# Patient Record
Sex: Male | Born: 1960
Health system: Southern US, Community
[De-identification: ages and names within clinical notes are randomized; demographics above are authoritative.]

## PROBLEM LIST (undated history)

## (undated) DIAGNOSIS — E039 Hypothyroidism, unspecified: Secondary | ICD-10-CM

## (undated) DIAGNOSIS — Z8639 Personal history of other endocrine, nutritional and metabolic disease: Secondary | ICD-10-CM

## (undated) DIAGNOSIS — Z96649 Presence of unspecified artificial hip joint: Secondary | ICD-10-CM

## (undated) DIAGNOSIS — M109 Gout, unspecified: Secondary | ICD-10-CM

## (undated) DIAGNOSIS — Z9884 Bariatric surgery status: Secondary | ICD-10-CM

## (undated) DIAGNOSIS — L409 Psoriasis, unspecified: Secondary | ICD-10-CM

## (undated) DIAGNOSIS — A6000 Herpesviral infection of urogenital system, unspecified: Secondary | ICD-10-CM

## (undated) DIAGNOSIS — R079 Chest pain, unspecified: Secondary | ICD-10-CM

## (undated) HISTORY — DX: Chest pain, unspecified: R07.9

## (undated) HISTORY — PX: TOTAL HIP ARTHROPLASTY: SHX124

## (undated) HISTORY — DX: Personal history of other endocrine, nutritional and metabolic disease: Z86.39

## (undated) HISTORY — PX: LASIK: SHX215

## (undated) HISTORY — PX: CYST EXCISION: SHX5701

## (undated) HISTORY — DX: Psoriasis, unspecified: L40.9

## (undated) HISTORY — DX: Gout, unspecified: M10.9

## (undated) HISTORY — PX: COLONOSCOPY: SHX174

---

## 1999-01-28 ENCOUNTER — Emergency Department (HOSPITAL_COMMUNITY): Admission: EM | Admit: 1999-01-28 | Discharge: 1999-01-29 | Payer: Self-pay | Admitting: Emergency Medicine

## 1999-01-28 ENCOUNTER — Encounter: Payer: Self-pay | Admitting: Emergency Medicine

## 1999-01-29 ENCOUNTER — Encounter: Payer: Self-pay | Admitting: Emergency Medicine

## 2006-08-16 ENCOUNTER — Emergency Department (HOSPITAL_COMMUNITY): Admission: EM | Admit: 2006-08-16 | Discharge: 2006-08-17 | Payer: Self-pay | Admitting: Emergency Medicine

## 2007-12-13 ENCOUNTER — Ambulatory Visit (HOSPITAL_BASED_OUTPATIENT_CLINIC_OR_DEPARTMENT_OTHER): Admission: RE | Admit: 2007-12-13 | Discharge: 2007-12-13 | Payer: Self-pay | Admitting: Surgery

## 2007-12-17 ENCOUNTER — Ambulatory Visit (HOSPITAL_COMMUNITY): Admission: RE | Admit: 2007-12-17 | Discharge: 2007-12-17 | Payer: Self-pay | Admitting: Surgery

## 2007-12-19 ENCOUNTER — Ambulatory Visit (HOSPITAL_COMMUNITY): Admission: RE | Admit: 2007-12-19 | Discharge: 2007-12-19 | Payer: Self-pay | Admitting: Surgery

## 2007-12-21 ENCOUNTER — Ambulatory Visit: Payer: Self-pay | Admitting: Internal Medicine

## 2007-12-30 ENCOUNTER — Encounter: Admission: RE | Admit: 2007-12-30 | Discharge: 2007-12-30 | Payer: Self-pay | Admitting: Surgery

## 2008-02-07 ENCOUNTER — Ambulatory Visit (HOSPITAL_COMMUNITY): Admission: RE | Admit: 2008-02-07 | Discharge: 2008-02-07 | Payer: Self-pay | Admitting: Surgery

## 2008-05-05 ENCOUNTER — Encounter: Admission: RE | Admit: 2008-05-05 | Discharge: 2008-08-03 | Payer: Self-pay | Admitting: Surgery

## 2008-05-19 ENCOUNTER — Ambulatory Visit (HOSPITAL_COMMUNITY): Admission: RE | Admit: 2008-05-19 | Discharge: 2008-05-20 | Payer: Self-pay | Admitting: Surgery

## 2008-09-01 ENCOUNTER — Encounter: Admission: RE | Admit: 2008-09-01 | Discharge: 2008-09-01 | Payer: Self-pay | Admitting: Surgery

## 2008-11-20 HISTORY — PX: BARIATRIC SURGERY: SHX1103

## 2010-12-11 ENCOUNTER — Encounter: Payer: Self-pay | Admitting: Emergency Medicine

## 2011-01-12 ENCOUNTER — Encounter (INDEPENDENT_AMBULATORY_CARE_PROVIDER_SITE_OTHER): Payer: Self-pay | Admitting: *Deleted

## 2011-01-13 ENCOUNTER — Encounter: Payer: Self-pay | Admitting: Gastroenterology

## 2011-01-17 NOTE — Letter (Signed)
Summary: [facility] Instructions  [facility]  [address] [place], [place] [address]   Phone: [phone]  Fax: [phone]       Brett Vaughn    June 10, 1961    MRN: [id number]        Procedure Day Dorna Bloom: Wednesday 01/25/11     Arrival Time: 1:00 pm      Procedure Time: 2:00 pm     Location of Procedure:                    _X_  [facility] (4th Floor)  PREPARATION FOR COLONOSCOPY WITH MOVIPREP   Starting 5 days prior to your procedure _Friday 01/20/11 _ do not eat nuts, seeds, popcorn, corn, beans, peas,  salads, or any raw vegetables.  Do not take any fiber supplements (e.g. Metamucil, Citrucel, and Benefiber).  THE DAY BEFORE YOUR PROCEDURE         DATE: _3/7/12  _  DAY: _Wednesday  _  1.  Drink clear liquids the entire day-NO SOLID FOOD  2.  Do not drink anything colored red or purple.  Avoid juices with pulp.  No orange juice.  3.  Drink at least 64 oz. (8 glasses) of fluid/clear liquids during the day to prevent dehydration and help the prep work efficiently.  CLEAR LIQUIDS INCLUDE: Water Jello Ice Popsicles Tea (sugar ok, no milk/cream) Powdered fruit flavored drinks Coffee (sugar ok, no milk/cream) Gatorade Juice: apple, white grape, white cranberry  Lemonade Clear bullion, consomm, broth Carbonated beverages (any kind) Strained chicken noodle soup Hard Candy                             4.  In the morning, mix first dose of MoviPrep solution:    Empty 1 Pouch A and 1 Pouch B into the disposable container    Add lukewarm drinking water to the top line of the container. Mix to dissolve    Refrigerate (mixed solution should be used within 24 hrs)  5.  Begin drinking the prep at 5:00 p.m. The MoviPrep container is divided by 4 marks.   Every 15 minutes drink the solution down to the next mark (approximately 8 oz) until the full liter is complete.   6.  Follow completed prep with 16 oz of clear liquid of your choice (Nothing red  or purple).  Continue to drink clear liquids until bedtime.  7.  Before going to bed, mix second dose of MoviPrep solution:    Empty 1 Pouch A and 1 Pouch B into the disposable container    Add lukewarm drinking water to the top line of the container. Mix to dissolve    Refrigerate  THE DAY OF YOUR PROCEDURE      DATE: _3/7/12  _ DAY: _Wednesday  _  Beginning at 9  a.m. (5 hours before procedure):         1. Every 15 minutes, drink the solution down to the next mark (approx 8 oz) until the full liter is complete.  2. Follow completed prep with 16 oz. of clear liquid of your choice.    3. You may drink clear liquids until _12 noon  _ (2 HOURS BEFORE PROCEDURE).   MEDICATION INSTRUCTIONS  Unless otherwise instructed, you should take regular prescription medications with a small sip of water   as early as possible the morning of your procedure.  OTHER INSTRUCTIONS  You will need a responsible adult at least 52 years of age to accompany you and drive you home.   This person must remain in the waiting room during your procedure.  Wear loose fitting clothing that is easily removed.  Leave jewelry and other valuables at home.  However, you may wish to bring a book to read or  an iPod/MP3 player to listen to music as you wait for your procedure to start.  Remove all body piercing jewelry and leave at home.  Total time from sign-in until discharge is approximately 2-3 hours.  You should go home directly after your procedure and rest.  You can resume normal activities the  day after your procedure.  The day of your procedure you should not:   Drive   Make legal decisions   Operate machinery   Drink alcohol   Return to work  You will receive specific instructions about eating, activities and medications before you leave.    The above instructions have been reviewed and explained to me by   Sherren Kerns RN  January 13, 2011 4:35 PM     I fully  understand and can verbalize these instructions _____________________________ Date _________

## 2011-01-17 NOTE — Miscellaneous (Signed)
Summary: previsit prep/rm  Clinical Lists Changes  Medications: Added new medication of MOVIPREP 100 GM  SOLR (PEG-KCL-NACL-NASULF-NA ASC-C) As per prep instructions. - Signed Rx of MOVIPREP 100 GM  SOLR (PEG-KCL-NACL-NASULF-NA ASC-C) As per prep instructions.;  #1 x 0;  Signed;  Entered by: Sherren Kerns RN;  Authorized by: Mardella Layman MD [facility];  Method used: [facility] [id number] [address], [place], [place], [place]  [address], Ph: [phone], Fax: [phone] Observations: Added new observation of ALLERGY REV: Done (01/13/2011 15:52) Added new observation of NKA: T (01/13/2011 15:52)    Prescriptions: MOVIPREP 100 GM  SOLR (PEG-KCL-NACL-NASULF-NA ASC-C) As per prep instructions.  #1 x 0   Entered by:   Sherren Kerns RN   Authorized by:   Mardella Layman MD [facility]   Signed by:   Sherren Kerns RN on 01/13/2011   Method used:   [facility]  [address] [id number]       [address]       [place], [place]  [address]       Ph: [phone]       Fax: [phone]   RxID:   [id number]

## 2011-01-23 ENCOUNTER — Telehealth: Payer: Self-pay | Admitting: Gastroenterology

## 2011-01-25 ENCOUNTER — Other Ambulatory Visit: Payer: Self-pay | Admitting: Gastroenterology

## 2011-01-31 NOTE — Progress Notes (Signed)
Summary: Canceled COL  Phone Note Call from Patient Call back at Home Phone [phone]   Caller: Patient Call For: Dr. Jarold Motto Summary of Call: Pt. canceled his COL for 01-25-11 b/c he found out that his Ins. would be billed as outpatient and his deductible is too high and cannot afford. Would you like pt. charged the cancelation fee? Initial call taken by: Karna Christmas,  January 23, 2011 11:00 AM  Follow-up for Phone Call        NO.. Follow-up by: Mardella Layman MD [facility],  January 23, 2011 11:03 AM  Additional Follow-up for Phone Call Additional follow up Details #1::        Patient NOT BILLED.  Additional Follow-up by: Leanor Kail [facility],  January 25, 2011 11:41 AM

## 2011-04-04 NOTE — Procedures (Signed)
Brett Vaughn, Brett Vaughn              ACCOUNT NO.:  [id number]   MEDICAL RECORD NO.:  [id number]          PATIENT TYPE:  OUT   LOCATION:  SLEEP CENTER                 FACILITY[facility]  [facility]   PHYSICIAN:  Clinton D. Maple Hudson, MD, FCCP, FACPDATE OF BIRTH:  February 03, 1959   DATE OF STUDY:  12/13/2007                            NOCTURNAL POLYSOMNOGRAM   REFERRING PHYSICIAN:  Thornton Park. Daphine Deutscher, MD   INDICATIONS FOR PROCEDURE:  Hypersomnia with sleep apnea.   RESULTS:  Epward sleepiness score 4/24, BMI 44.8, weight 330 pounds.  Height 72 inches. Neck 20 inches.   HOME MEDICATIONS:  Charted and reviewed.   SLEEP ARCHITECTURE:  Total sleep time 412.5 minutes with sleep  efficiency 90.9%. Stage 1 was 2.1%; Stage 2, 79.4%; Stage 3 absent. REM  18.5% of total sleep time. Sleep latency 26 minutes, REM latency 142  minutes, awake after sleep onset 12 minutes, arousal index 4.1. No  bedtime medication taken.   RESPIRATORY DATA:  Apnea/hypopnea index (AHI) 11.8 obstructive events  per hour, indicating mild obstructive sleep apnea/hypopnea syndrome.  There were 81 scored events including 1 obstructive apnea and 80  hypopnea's. Events were not positional. This was a diagnostic NP SG  protocol study. REM AHI was 30.6.   OXYGEN DATA:  Moderate snoring with oxygen desaturation to a nadir of  82%. Mean oxygen saturation through the study was 90.6% on room air.   CARDIAC DATA:  Normal sinus rhythm.   MOVEMENT/PARASOMNIA:  A total of 25 periodic limb movement events were  counted, of which none were identified with arousals. Movement index was  3.6.   IMPRESSION/RECOMMENDATIONS:  1. Mild obstructive sleep apnea/hypopnea syndrome, AHI 11.8 per hour      with non-positional events. Increased frequency while in REM.      Moderate snoring and mild oxygen desaturation to a nadir of 82%.  2. Scores in this range may be appropriate addressed with CPAP      intervention but weight loss and treatment for      upper  airway obstruction or congestion with encouragement to sleep      off flat of back may be sufficient, as clinically appropriate.      Clinton D. Maple Hudson, MD, FCCP, FACP  Diplomate, Biomedical engineer of Sleep Medicine  Electronically Signed     CDY/MEDQ  D:  12/21/2007 11:57:30  T:  12/21/2007 22:51:17  Job:  [id number]

## 2011-04-04 NOTE — Op Note (Signed)
NAMEDARWIN, ROTHLISBERGER              ACCOUNT NO.:  [id number]   MEDICAL RECORD NO.:  [id number]          PATIENT TYPE:  OIB   LOCATION:  0098                         FACILITY:  [facility]   PHYSICIAN:  Thornton Park. Daphine Deutscher, MD  DATE OF BIRTH:  1961/03/24   DATE OF PROCEDURE:  05/19/2008  DATE OF DISCHARGE:                               OPERATIVE REPORT   05/19/2008   PREOPERATIVE DIAGNOSIS:  Morbid obesity, preop BMI 46.   PROCEDURE:  Laparoscopic adjustable gastric band (Allergan APL system).   SURGEON:  Thornton Park. Daphine Deutscher, MD.   ASSISTANTMarland Kitchen  Sandria Bales. Ezzard Standing, M.D.   ANESTHESIA:  General endotracheal.   FINDINGS:  No hiatal hernia noted on the balloon test.   DESCRIPTION OF PROCEDURE:  Bronc Brosseau is a 50 year old Caucasian  male taken room #1 on May 19, 2008.  He is placed under general  anesthesia.  The abdomen was clipped, prepped with Techni-Care, and  draped sterilely.  I entered the abdomen through the left upper quadrant  using an OptiVu technique without difficulty.  Once I entered the  abdomen, it was insufflated.  There were some adhesions in the left  upper quadrant which were taken down sharply with dissection.  This was  done after I put the optical port down to the left of the umbilicus with  the 2 ports on the right side.  The 15 was placed in the upper position  on the right.  It was placed at a very oblique angle.   Once this was done, I surveyed the abdomen; and then went up and  surveyed after I placed the Nathanson's retractor at the EG junction.  Because of its size preoperatively, and it being a man, I elected to put  in an APL band system.  He had a modest amount of fat around his  proximal foregut.  The tube was inserted into the stomach, and 15 mL of  air was put into the balloon.  This was then pulled back, and it hung up  at the diaphragm.  There was no evidence of a sliding hernia based on  this test.  I did not see any dimpling; and because the patient  had no  symptoms of reflux, again, asking him in the holding area as well as  preoperatively; I elected to not try to do anything further in the way  of dissection.  I went ahead and easily found a spot, using a pars  flaccida technique, preserving his hepatic branch of his vagal nerve,  and after I opened the window passed a band passer without difficulty.  The APL band was inserted, brought around, and it was snapped in place.  It was then closed over the tubing and to make sure that the locking  device was seen on the band.  It was then held to the right, and was  plicated with 3 sutures of Surgitek and tie knots.   Everything looked to be in order, and looked good, and moved easily.  The tubing was then brought through the port on the right, on the lower  side, which  was expanded.  I created a place for the port on the fascia;  and the port came out to lie a little bit toward the medial side of that  incision.  It was replaced with 4 sutures sutured to the fascia; and  after connecting, it looked good and the excess tubing was placed into  the abdomen.  Wounds were irrigated and closed with 4-0 Vicryl and  Benzoin and Steri-Strips.  The patient tolerated the procedure well and  was taken to recovery room in satisfactory condition.      Thornton Park Daphine Deutscher, MD  Electronically Signed     MBM/MEDQ  D:  05/19/2008  T:  05/19/2008  Job:  [id number]   cc:   Quita Skye. Artis Flock, M.D.  Fax: [phone]

## 2011-08-17 LAB — BASIC METABOLIC PANEL
Calcium: 9.5
Chloride: 105
Creatinine, Ser: 1.11

## 2011-08-17 LAB — DIFFERENTIAL
Basophils Absolute: 0.1
Basophils Relative: 1
Neutro Abs: 5.6
Neutrophils Relative %: 62

## 2011-08-17 LAB — HEMOGLOBIN AND HEMATOCRIT, BLOOD: Hemoglobin: 16.8

## 2011-08-17 LAB — CBC
MCHC: 34.2
RBC: 4.6
RDW: 14.5

## 2011-10-16 ENCOUNTER — Telehealth (INDEPENDENT_AMBULATORY_CARE_PROVIDER_SITE_OTHER): Payer: Self-pay | Admitting: Surgery

## 2011-10-16 NOTE — Telephone Encounter (Signed)
10/16/11 recall letter mailed to patient for bariatric surgery follow-up. Adv pt to call [facility] to schedule an appt..cef °

## 2012-12-05 ENCOUNTER — Telehealth (INDEPENDENT_AMBULATORY_CARE_PROVIDER_SITE_OTHER): Payer: Self-pay | Admitting: Surgery

## 2012-12-05 NOTE — Telephone Encounter (Signed)
12/05/12 tried to reach pt to make bariatric surgery follow-up appt. His phone was not set up to receive messages. I mailed recall letter to call [phone] and schedule a bariatric follow-up with Dr. Daphine Deutscher. Pt last had lap band fill adjustment 09/09/09. (lss)

## 2013-10-27 ENCOUNTER — Ambulatory Visit
Admission: RE | Admit: 2013-10-27 | Discharge: 2013-10-27 | Disposition: A | Discharge status: Home / Self Care | Payer: BC Managed Care – PPO | Source: Ambulatory Visit | Attending: Physician Assistant | Admitting: Physician Assistant

## 2013-10-27 ENCOUNTER — Other Ambulatory Visit: Payer: Self-pay | Admitting: Physician Assistant

## 2013-10-27 DIAGNOSIS — R059 Cough, unspecified: Secondary | ICD-10-CM

## 2013-10-27 DIAGNOSIS — R05 Cough: Secondary | ICD-10-CM

## 2013-10-27 DIAGNOSIS — R509 Fever, unspecified: Secondary | ICD-10-CM

## 2014-02-17 ENCOUNTER — Ambulatory Visit (INDEPENDENT_AMBULATORY_CARE_PROVIDER_SITE_OTHER): Payer: BC Managed Care – PPO | Admitting: Cardiovascular Disease

## 2014-02-17 ENCOUNTER — Encounter: Payer: Self-pay | Admitting: Cardiovascular Disease

## 2014-02-17 ENCOUNTER — Encounter: Payer: Self-pay | Admitting: *Deleted

## 2014-02-17 VITALS — BP 108/70 | HR 72 | Ht 70.0 in | Wt 264.4 lb

## 2014-02-17 DIAGNOSIS — M109 Gout, unspecified: Secondary | ICD-10-CM | POA: Insufficient documentation

## 2014-02-17 DIAGNOSIS — R079 Chest pain, unspecified: Secondary | ICD-10-CM | POA: Insufficient documentation

## 2014-02-17 DIAGNOSIS — L409 Psoriasis, unspecified: Secondary | ICD-10-CM | POA: Insufficient documentation

## 2014-02-17 DIAGNOSIS — Z8639 Personal history of other endocrine, nutritional and metabolic disease: Secondary | ICD-10-CM | POA: Insufficient documentation

## 2014-02-17 NOTE — Progress Notes (Signed)
Patient ID: Brett Vaughn, male   DOB: 03-May-1961, 53 y.o.   MRN: [id number]  53 yo referred by Dr Marisue Humble for chest pain Pain is very atypical.  Hervey Ard and fleeting going on for a few weeks.  Positional and some help with motrin.  No history of CAD No other CRFls  Obes with previous lab band.  Somewhat sedentary Pain is not worse with ambulaiton or stress.  No fever pleurisy no history of pericardial disease  Compliant with meds Pains actually improved last two weeks.  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  All other systems reviewed and negative   General: Affect appropriate Healthy:  appears stated age 5: normal Neck supple with no adenopathy JVP normal no bruits no thyromegaly Lungs clear with no wheezing and good diaphragmatic motion Heart:  S1/S2 no murmur,rub, gallop or click PMI normal Abdomen: benighn, BS positve, no tenderness, no AAA  Porta cath form lab band palpable RLQ no bruit.  No HSM or HJR Distal pulses intact with no bruits No edema Neuro non-focal Skin warm and dry  Multiple tatoos  No muscular weakness  Medications Current Outpatient Prescriptions  Medication Sig Dispense Refill  . levothyroxine (SYNTHROID, LEVOTHROID) 88 MCG tablet Take 88 mcg by mouth daily before breakfast.      . Multiple Vitamin (MULTIVITAMIN) tablet Take 1 tablet by mouth daily.       No current facility-administered medications for this visit.    Allergies Penicillins  Family History: Family History  Problem Relation Age of Onset  . Prostate cancer      Social History: History   Social History  . Marital Status: Divorced    Spouse Name: N/A    Number of Children: N/A  . Years of  Education: N/A   Occupational History  . Not on file.   Social History Main Topics  . Smoking status: Former Smoker    Quit date: 02/17/1985  . Smokeless tobacco: Not on file  . Alcohol Use: Not on file  . Drug Use: Not on file  . Sexual Activity: Not on file   Other Topics Concern  . Not on file   Social History Narrative  . No narrative on file    Electrocardiogram:  NSR normal ECG   Assessment and Plan

## 2014-02-17 NOTE — Assessment & Plan Note (Signed)
Was as high as 386 before surgery the down to 286 with recent weight gain due to wifes cooking Discussed low carb diet f/u primary and consider referral to bariatric center to follow band

## 2014-02-17 NOTE — Assessment & Plan Note (Signed)
Atypical normal ECG f/u ETT

## 2014-02-17 NOTE — Patient Instructions (Signed)

## 2014-03-20 ENCOUNTER — Ambulatory Visit (INDEPENDENT_AMBULATORY_CARE_PROVIDER_SITE_OTHER): Payer: BC Managed Care – PPO | Admitting: Physician Assistant

## 2014-03-20 DIAGNOSIS — R079 Chest pain, unspecified: Secondary | ICD-10-CM

## 2014-03-20 NOTE — Progress Notes (Signed)
Exercise Treadmill Test  Pre-Exercise Testing Evaluation Rhythm: normal sinus  Rate: 63 bpm     Test  Exercise Tolerance Test Ordering MD: Jenkins Rouge, MD  Interpreting MD: Richardson Dopp, PA-C  Unique Test No: 1  Treadmill:  1  Indication for ETT: chest pain - rule out ischemia  Contraindication to ETT: No   Stress Modality: exercise - treadmill  Cardiac Imaging Performed: non   Protocol: standard Bruce - maximal  Max BP:  193/88  Max MPHR (bpm):  166 85% MPR (bpm):  141  MPHR obtained (bpm):  164 % MPHR obtained:  98  Reached 85% MPHR (min:sec):  4:38 Total Exercise Time (min-sec):  7:00  Workload in METS:  8.4 Borg Scale: 16  Reason ETT Terminated:  desired heart rate attained    ST Segment Analysis At Rest: normal ST segments - no evidence of significant ST depression With Exercise: non-specific ST changes  Other Information Arrhythmia:  No Angina during ETT:  absent (0) Quality of ETT:  diagnostic  ETT Interpretation:  normal - no evidence of ischemia by ST analysis  Comments: Good exercise capacity. No chest pain. Normal BP response to exercise. No ST changes to suggest ischemia.   Recommendations: F/u with Dr. Jenkins Rouge as planned. Signed, Richardson Dopp, PA-C   03/20/2014 10:54 AM

## 2014-03-25 ENCOUNTER — Ambulatory Visit (INDEPENDENT_AMBULATORY_CARE_PROVIDER_SITE_OTHER): Payer: BC Managed Care – PPO | Admitting: Surgery

## 2014-03-25 ENCOUNTER — Encounter (INDEPENDENT_AMBULATORY_CARE_PROVIDER_SITE_OTHER): Payer: Self-pay | Admitting: Surgery

## 2014-03-25 VITALS — BP 118/78 | HR 78 | Temp 97.0°F | Resp 14 | Ht 71.0 in | Wt 257.2 lb

## 2014-03-25 DIAGNOSIS — Z9884 Bariatric surgery status: Secondary | ICD-10-CM

## 2014-03-25 NOTE — Patient Instructions (Signed)
Laparoscopic Gastric Band Surgery Care After These instructions give you information on caring for yourself after your procedure. Your doctor may also give you more specific instructions. Call your doctor if you have any problems or questions after your procedure. HOME CARE   Take walks throughout the day. Do not sit for longer than 1 hour while awake for 4 to 6 weeks.  You may shower 2 days after surgery. Pat the surgery cuts (incisions) dry. Do not rub the surgery cuts.  Do your coughing and deep breathing exercises.  Do not lift, push, or pull anything heavy until your doctor says it is okay.  Only take medicines as told by your doctor. Do not drive while taking pain medicine.  Drink plenty of fluids to keep your pee (urine) clear or pale yellow.  Stay on a liquid diet as long as your doctor tells you to.  Do not drink caffeine for 1 month.  Change bandages (dressings) as told by your doctor.  Check your surgery cuts for redness, pufffiness (swelling), abnormal coloring, fluid, or bleeding.  Follow your doctor's advice about vitamin and protein needs after surgery. GET HELP RIGHT AWAY IF:  You feel sick to your stomach (nauseous) and throw up (vomit).  You have pain and discomfort with swallowing.  You develop shortness of breath or difficulty breathing.  You have pain, puffiness, or feel warmth on your lower body.  You have very bad calf pain or pain not relieved by medicine.  You have a temperature by mouth above 102 F (38.9 C).  Your surgery cuts look red, puffy, or they leak fluid.  Your poop (stool) is black, tarry, or dark red.  You have chills.  You have chest pain.  You feel confused.  You have slurred speech.  You feel lightheaded when standing.  You suddenly feel weak.  You have any questions or concerns. MAKE SURE YOU:   Understand these instructions.  Will watch your condition.  Will get help right away if you are not doing well or get  worse. Document Released: 12/09/2010 Document Revised: 03/03/2013 Document Reviewed: 12/09/2010 [facility] Patient Information 2014 [place], [place].

## 2014-03-25 NOTE — Progress Notes (Signed)
Lapband Fill Encounter Problem List:   Patient Active Problem List   Diagnosis Date Noted  . Lapband APL 2009 03/25/2014  . Morbid obesity 02/17/2014  . Chest pain   . H/O hypogonadism   . Gout   . Psoriasis     Brett Vaughn Body mass index is 35.89 kg/(m^2). Weight loss since surgery  73.6   Having regurgitation?:  Yes since Sunday  Feel that they need a fill?  Too tight  Nocturnal reflux?  yes  Amount of fill  -5 cc     Instructions given and weight loss goals discussed.    On Sunday, patient ate some cottage cheese and pineapple and became obstructed.  Since then he has been partially obstructed to liquids.  Suspect anterior prolapse of APL band.  Fluid removed.    [name] Done, MD, FACS

## 2014-03-25 NOTE — Progress Notes (Signed)
Normal stress test

## 2014-03-27 NOTE — Progress Notes (Signed)
PT  AWARE OF  GXT  RESULTS ./CY 

## 2014-04-10 ENCOUNTER — Encounter (INDEPENDENT_AMBULATORY_CARE_PROVIDER_SITE_OTHER): Payer: Self-pay | Admitting: Surgery

## 2014-04-10 ENCOUNTER — Ambulatory Visit (INDEPENDENT_AMBULATORY_CARE_PROVIDER_SITE_OTHER): Payer: BC Managed Care – PPO | Admitting: Surgery

## 2014-04-10 VITALS — BP 132/82 | HR 72 | Temp 97.5°F | Resp 16 | Ht 71.0 in | Wt 273.4 lb

## 2014-04-10 DIAGNOSIS — Z9884 Bariatric surgery status: Secondary | ICD-10-CM

## 2014-04-10 NOTE — Progress Notes (Signed)
Lapband Fill Encounter Problem List:   Patient Active Problem List   Diagnosis Date Noted  . Lapband APL 2009 03/25/2014  . Morbid obesity 02/17/2014  . Chest pain   . H/O hypogonadism   . Gout   . Psoriasis     Brett Vaughn Body mass index is 38.15 kg/(m^2). Weight loss since surgery  57  Having regurgitation?:  no  Feel that they need a fill?  yes  Nocturnal reflux?  no  Amount of fill  4     Instructions given and weight loss goals discussed.    He has gained 17 lbs during his band vacation.  He went to [facility] bike week and ate and drank a lot.  Will see back in 6 weeks or before if not tight enough  [name] Done, MD, FACS

## 2014-04-10 NOTE — Patient Instructions (Signed)

## 2014-05-15 ENCOUNTER — Ambulatory Visit (INDEPENDENT_AMBULATORY_CARE_PROVIDER_SITE_OTHER): Payer: BC Managed Care – PPO | Admitting: Surgery

## 2014-05-15 ENCOUNTER — Encounter (INDEPENDENT_AMBULATORY_CARE_PROVIDER_SITE_OTHER): Payer: Self-pay | Admitting: Surgery

## 2014-05-15 VITALS — BP 118/75 | HR 58 | Temp 97.9°F | Resp 14 | Ht 71.0 in | Wt 273.6 lb

## 2014-05-15 DIAGNOSIS — Z4651 Encounter for fitting and adjustment of gastric lap band: Secondary | ICD-10-CM

## 2014-05-15 NOTE — Patient Instructions (Signed)

## 2014-05-15 NOTE — Progress Notes (Signed)
Lapband Fill Encounter Problem List:   Patient Active Problem List   Diagnosis Date Noted  . Lapband APL 2009 03/25/2014  . Morbid obesity 02/17/2014  . Chest pain   . H/O hypogonadism   . Gout   . Psoriasis     Brett Vaughn Body mass index is 38.18 kg/(m^2). Weight loss since surgery  57  Having regurgitation?:  no  Feel that they need a fill?  yes  Nocturnal reflux?  no  Amount of fill  0.5     Instructions given and weight loss goals discussed.    He is doing well and wants a fill he has been able to eat more since I put him on to a band vacation.  His office chart is not available.    [name] Done, MD, FACS

## 2014-05-21 ENCOUNTER — Encounter (INDEPENDENT_AMBULATORY_CARE_PROVIDER_SITE_OTHER): Payer: BC Managed Care – PPO | Admitting: Surgery

## 2014-07-01 ENCOUNTER — Encounter (INDEPENDENT_AMBULATORY_CARE_PROVIDER_SITE_OTHER): Payer: Self-pay | Admitting: Surgery

## 2014-07-01 ENCOUNTER — Ambulatory Visit (INDEPENDENT_AMBULATORY_CARE_PROVIDER_SITE_OTHER): Payer: BC Managed Care – PPO | Admitting: Surgery

## 2014-07-01 VITALS — BP 120/70 | HR 64 | Resp 16 | Ht 71.0 in | Wt 278.6 lb

## 2014-07-01 DIAGNOSIS — Z4651 Encounter for fitting and adjustment of gastric lap band: Secondary | ICD-10-CM

## 2014-07-01 NOTE — Progress Notes (Signed)
Lapband Fill Encounter Problem List:   Patient Active Problem List   Diagnosis Date Noted  . Lapband APL 2009 03/25/2014  . Morbid obesity 02/17/2014  . Chest pain   . H/O hypogonadism   . Gout   . Psoriasis     Brett Vaughn Body mass index is 38.87 kg/(m^2). Weight loss since surgery  Unable to locate old chart  Having regurgitation?:  no  Feel that they need a fill?  yes  Nocturnal reflux?  no  Amount of fill  1 cc     Instructions given and weight loss goals discussed.    Today's weight is 278.  No old chart is available.  He will call be in 6 weeks as needed.   [name] Done, MD, FACS

## 2014-07-01 NOTE — Patient Instructions (Signed)

## 2014-07-03 ENCOUNTER — Encounter (INDEPENDENT_AMBULATORY_CARE_PROVIDER_SITE_OTHER): Payer: BC Managed Care – PPO | Admitting: Surgery

## 2014-07-25 ENCOUNTER — Telehealth (INDEPENDENT_AMBULATORY_CARE_PROVIDER_SITE_OTHER): Payer: Self-pay | Admitting: Surgery

## 2014-07-25 MED ORDER — PROMETHAZINE HCL 25 MG RE SUPP: 25.0000 mg | Freq: Four times a day (QID) | RECTAL | Status: DC | PRN

## 2014-07-25 MED ORDER — ONDANSETRON HCL 4 MG PO TABS: 4.0000 mg | ORAL_TABLET | Freq: Three times a day (TID) | ORAL | Status: DC | PRN

## 2014-07-25 NOTE — Telephone Encounter (Signed)
Brett Vaughn  1961/08/07 [id number]  Patient Care Team: Donnie Coffin, MD as PCP - General (Family Medicine)  This patient is a 53 y.o.male who calls today for surgical evaluation.   Date of procedure/visit: 05/19/2008  Surgery: PREOPERATIVE DIAGNOSIS: Morbid obesity, preop BMI 46.  PROCEDURE: Laparoscopic adjustable gastric band (Allergan APL system).  SURGEON: Isabel Caprice. Hassell Done, MD.    Reason for call: n/v  Gentleman status post adjustable banding.  Claims he had a slippage that had to be fixed.  Has been followed more recently with adjustments.  Patient had episode of severe nausea and retching after eating last night.  Denies any sick contacts.  No diarrhea.  He has had difficulty eating food since then.  Took long time he has a power bar.  I strongly recommend he drink 30-55mL q1h warm liquids only for the first 24 hours.  Then switched to liquid or at most pureed diet only.  No solids.  If not improved, may need to do x-ray studies & further evaluations to make sure there is not another slippage.  Prescriptions for Zofran and Phenergan sent to his pharmacy  Adin Hector, M.D., F.A.C.S. Gastrointestinal and Minimally Invasive Surgery [facility] [address] [place], [place] [address] [phone] Main / Paging    Patient Active Problem List   Diagnosis Date Noted  . Lapband APL 2009 03/25/2014  . Morbid obesity 02/17/2014  . Chest pain   . H/O hypogonadism   . Gout   . Psoriasis     Past Medical History  Diagnosis Date  . Chest pain   . H/O hypogonadism   . Gout   . Psoriasis     No past surgical history on file.  History   Social History  . Marital Status: Divorced    Spouse Name: N/A    Number of Children: N/A  . Years of Education: N/A   Occupational History  . Not on file.   Social History Main Topics  . Smoking status: Former Smoker    Quit date: 02/17/1985  . Smokeless tobacco: Not on file  .  Alcohol Use: Not on file  . Drug Use: Not on file  . Sexual Activity: Not on file   Other Topics Concern  . Not on file   Social History Narrative  . No narrative on file    Family History  Problem Relation Age of Onset  . Prostate cancer      Current Outpatient Prescriptions  Medication Sig Dispense Refill  . CIALIS 20 MG tablet       . levothyroxine (SYNTHROID, LEVOTHROID) 88 MCG tablet Take 88 mcg by mouth daily before breakfast.      . Multiple Vitamin (MULTIVITAMIN) tablet Take 1 tablet by mouth daily.      . ondansetron (ZOFRAN) 4 MG tablet Take 1 tablet (4 mg total) by mouth every 8 (eight) hours as needed for nausea.  8 tablet  5  . promethazine (PHENERGAN) 25 MG suppository Place 1 suppository (25 mg total) rectally every 6 (six) hours as needed for nausea.  5 suppository  5  . tobramycin-dexamethasone (TOBRADEX) ophthalmic solution        No current facility-administered medications for this visit.     Allergies  Allergen Reactions  . Penicillins     @VS @  No results found.  Note: This dictation was prepared with Dragon/digital dictation along with [employer]. Any transcriptional errors that result from this process are unintentional.

## 2014-07-25 NOTE — Telephone Encounter (Signed)
Recommended patient go to emergency room if not feeling better.  See prior telephone note

## 2015-04-08 IMAGING — CR DG CHEST 2V
2 series · 2 of 2 positions shown · non-contrast
Comparison: 12/19/2007

CLINICAL DATA: Cough and fever

EXAM:
CHEST  2 VIEW

[w chest pa]
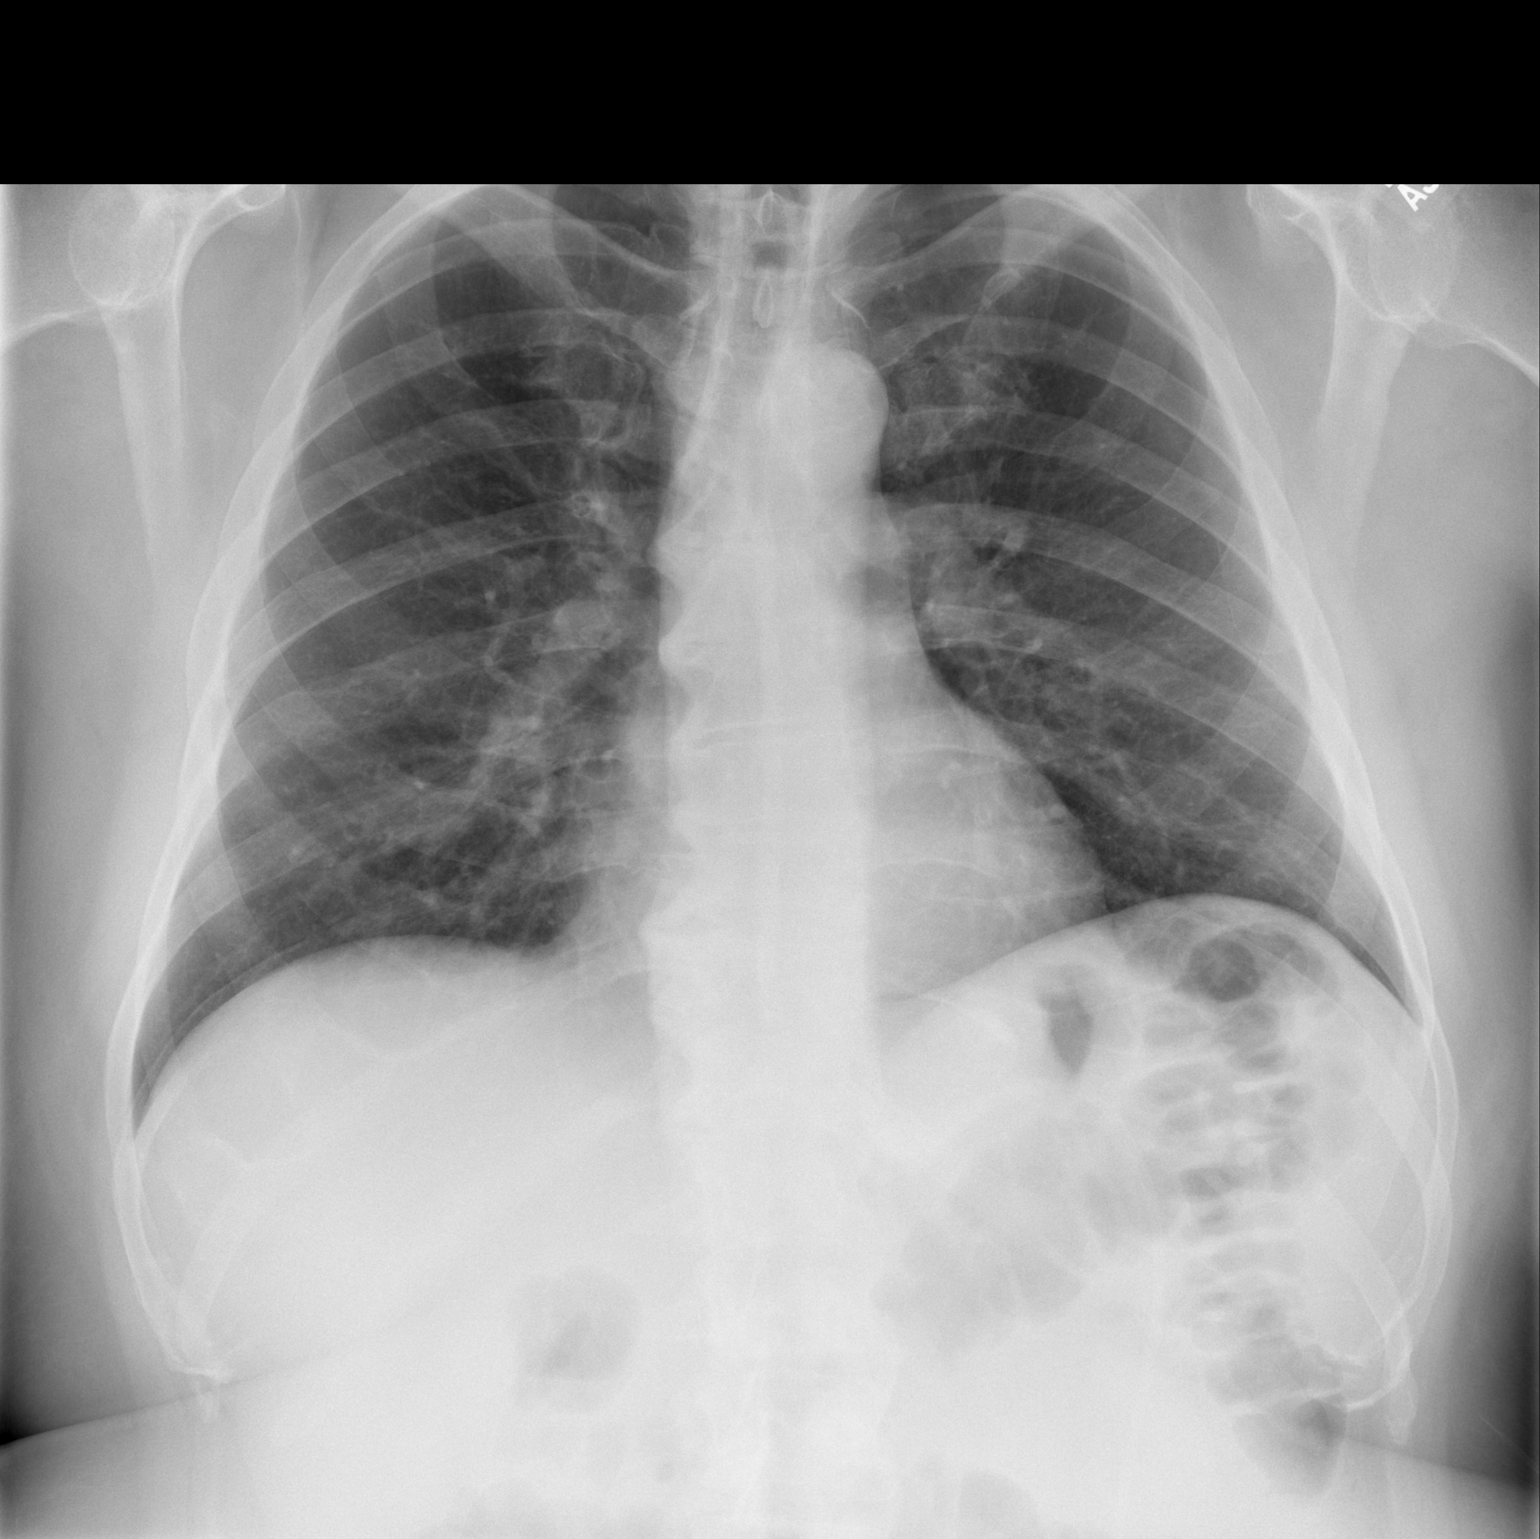

[w chest lat]
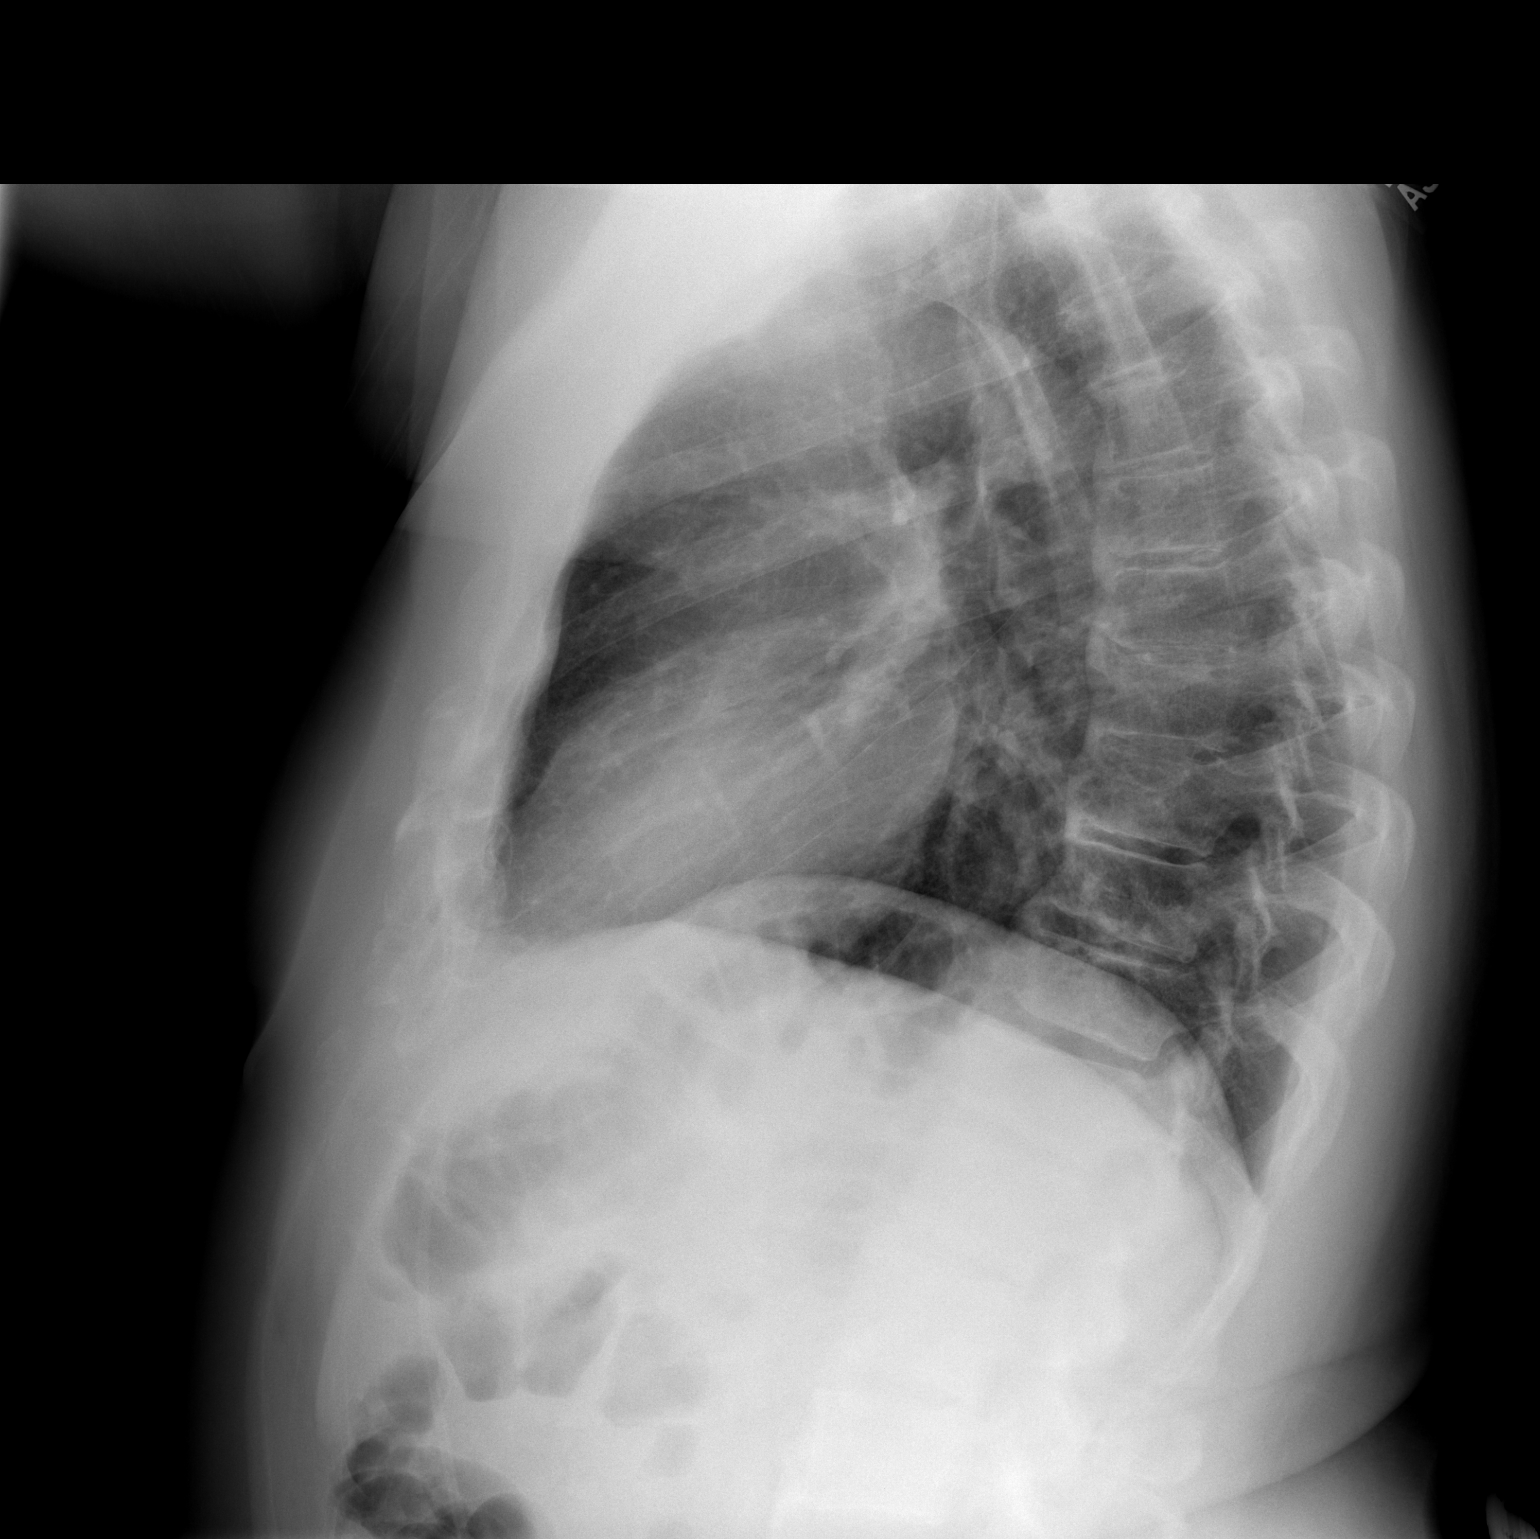

[2 of 2 positions shown; findings below may reference images not displayed]

FINDINGS: The lungs are clear. Negative for pneumonia. Negative for heart
failure mass or effusion.
IMPRESSION: No active cardiopulmonary disease.

## 2016-04-11 DIAGNOSIS — J029 Acute pharyngitis, unspecified: Secondary | ICD-10-CM | POA: Diagnosis not present

## 2016-04-11 DIAGNOSIS — J020 Streptococcal pharyngitis: Secondary | ICD-10-CM | POA: Diagnosis not present

## 2016-04-24 DIAGNOSIS — M79645 Pain in left finger(s): Secondary | ICD-10-CM | POA: Diagnosis not present

## 2016-05-05 DIAGNOSIS — M19042 Primary osteoarthritis, left hand: Secondary | ICD-10-CM | POA: Diagnosis not present

## 2016-05-05 DIAGNOSIS — M79645 Pain in left finger(s): Secondary | ICD-10-CM | POA: Diagnosis not present

## 2016-05-11 DIAGNOSIS — L720 Epidermal cyst: Secondary | ICD-10-CM | POA: Diagnosis not present

## 2016-05-11 DIAGNOSIS — L570 Actinic keratosis: Secondary | ICD-10-CM | POA: Diagnosis not present

## 2016-06-06 DIAGNOSIS — L089 Local infection of the skin and subcutaneous tissue, unspecified: Secondary | ICD-10-CM | POA: Diagnosis not present

## 2016-08-21 DIAGNOSIS — L0100 Impetigo, unspecified: Secondary | ICD-10-CM | POA: Diagnosis not present

## 2016-08-21 DIAGNOSIS — L559 Sunburn, unspecified: Secondary | ICD-10-CM | POA: Diagnosis not present

## 2016-08-22 DIAGNOSIS — Z0000 Encounter for general adult medical examination without abnormal findings: Secondary | ICD-10-CM | POA: Diagnosis not present

## 2016-08-22 DIAGNOSIS — Z1211 Encounter for screening for malignant neoplasm of colon: Secondary | ICD-10-CM | POA: Diagnosis not present

## 2016-08-22 DIAGNOSIS — E039 Hypothyroidism, unspecified: Secondary | ICD-10-CM | POA: Diagnosis not present

## 2016-08-22 DIAGNOSIS — Z23 Encounter for immunization: Secondary | ICD-10-CM | POA: Diagnosis not present

## 2016-08-22 DIAGNOSIS — E669 Obesity, unspecified: Secondary | ICD-10-CM | POA: Diagnosis not present

## 2016-08-22 DIAGNOSIS — Z8042 Family history of malignant neoplasm of prostate: Secondary | ICD-10-CM | POA: Diagnosis not present

## 2016-10-26 DIAGNOSIS — L509 Urticaria, unspecified: Secondary | ICD-10-CM | POA: Diagnosis not present

## 2016-12-05 ENCOUNTER — Encounter (HOSPITAL_COMMUNITY): Payer: Self-pay

## 2017-01-18 DIAGNOSIS — J069 Acute upper respiratory infection, unspecified: Secondary | ICD-10-CM | POA: Diagnosis not present

## 2017-02-02 DIAGNOSIS — L723 Sebaceous cyst: Secondary | ICD-10-CM | POA: Diagnosis not present

## 2017-02-12 DIAGNOSIS — L111 Transient acantholytic dermatosis [Grover]: Secondary | ICD-10-CM | POA: Diagnosis not present

## 2017-02-12 DIAGNOSIS — L570 Actinic keratosis: Secondary | ICD-10-CM | POA: Diagnosis not present

## 2017-02-12 DIAGNOSIS — L723 Sebaceous cyst: Secondary | ICD-10-CM | POA: Diagnosis not present

## 2017-04-04 ENCOUNTER — Other Ambulatory Visit: Payer: Self-pay | Admitting: Family Medicine

## 2017-04-04 DIAGNOSIS — R1031 Right lower quadrant pain: Secondary | ICD-10-CM

## 2017-04-09 ENCOUNTER — Ambulatory Visit
Admission: RE | Admit: 2017-04-09 | Discharge: 2017-04-09 | Disposition: A | Discharge status: Home / Self Care | Payer: BLUE CROSS/BLUE SHIELD | Source: Ambulatory Visit | Attending: Family Medicine | Admitting: Family Medicine

## 2017-04-09 DIAGNOSIS — K4090 Unilateral inguinal hernia, without obstruction or gangrene, not specified as recurrent: Secondary | ICD-10-CM | POA: Diagnosis not present

## 2017-04-09 DIAGNOSIS — R1031 Right lower quadrant pain: Secondary | ICD-10-CM

## 2017-04-09 MED ORDER — IOPAMIDOL (ISOVUE-300) INJECTION 61%
100.0000 mL | Freq: Once | INTRAVENOUS | Status: AC | PRN
  Administered 2017-04-09: 100 mL via INTRAVENOUS

## 2017-09-24 DIAGNOSIS — E039 Hypothyroidism, unspecified: Secondary | ICD-10-CM | POA: Diagnosis not present

## 2017-09-24 DIAGNOSIS — Z1211 Encounter for screening for malignant neoplasm of colon: Secondary | ICD-10-CM | POA: Diagnosis not present

## 2017-09-24 DIAGNOSIS — Z0000 Encounter for general adult medical examination without abnormal findings: Secondary | ICD-10-CM | POA: Diagnosis not present

## 2017-09-24 DIAGNOSIS — Z23 Encounter for immunization: Secondary | ICD-10-CM | POA: Diagnosis not present

## 2017-09-24 DIAGNOSIS — Z8042 Family history of malignant neoplasm of prostate: Secondary | ICD-10-CM | POA: Diagnosis not present

## 2017-10-17 DIAGNOSIS — M25511 Pain in right shoulder: Secondary | ICD-10-CM | POA: Diagnosis not present

## 2017-12-04 ENCOUNTER — Encounter (HOSPITAL_COMMUNITY): Payer: Self-pay

## 2017-12-12 DIAGNOSIS — J069 Acute upper respiratory infection, unspecified: Secondary | ICD-10-CM | POA: Diagnosis not present

## 2018-01-15 DIAGNOSIS — J029 Acute pharyngitis, unspecified: Secondary | ICD-10-CM | POA: Diagnosis not present

## 2018-02-08 DIAGNOSIS — J209 Acute bronchitis, unspecified: Secondary | ICD-10-CM | POA: Diagnosis not present

## 2018-02-08 DIAGNOSIS — J329 Chronic sinusitis, unspecified: Secondary | ICD-10-CM | POA: Diagnosis not present

## 2018-06-13 DIAGNOSIS — E291 Testicular hypofunction: Secondary | ICD-10-CM | POA: Diagnosis not present

## 2018-06-21 DIAGNOSIS — E291 Testicular hypofunction: Secondary | ICD-10-CM | POA: Diagnosis not present

## 2018-07-05 DIAGNOSIS — E291 Testicular hypofunction: Secondary | ICD-10-CM | POA: Diagnosis not present

## 2018-07-19 DIAGNOSIS — E291 Testicular hypofunction: Secondary | ICD-10-CM | POA: Diagnosis not present

## 2018-08-02 DIAGNOSIS — Z23 Encounter for immunization: Secondary | ICD-10-CM | POA: Diagnosis not present

## 2018-08-02 DIAGNOSIS — E291 Testicular hypofunction: Secondary | ICD-10-CM | POA: Diagnosis not present

## 2018-08-12 DIAGNOSIS — J069 Acute upper respiratory infection, unspecified: Secondary | ICD-10-CM | POA: Diagnosis not present

## 2018-08-30 DIAGNOSIS — E291 Testicular hypofunction: Secondary | ICD-10-CM | POA: Diagnosis not present

## 2018-09-13 DIAGNOSIS — B009 Herpesviral infection, unspecified: Secondary | ICD-10-CM | POA: Diagnosis not present

## 2018-09-13 DIAGNOSIS — F411 Generalized anxiety disorder: Secondary | ICD-10-CM | POA: Diagnosis not present

## 2018-09-13 DIAGNOSIS — J309 Allergic rhinitis, unspecified: Secondary | ICD-10-CM | POA: Diagnosis not present

## 2018-09-27 DIAGNOSIS — E291 Testicular hypofunction: Secondary | ICD-10-CM | POA: Diagnosis not present

## 2018-11-01 DIAGNOSIS — F411 Generalized anxiety disorder: Secondary | ICD-10-CM | POA: Diagnosis not present

## 2018-11-01 DIAGNOSIS — E291 Testicular hypofunction: Secondary | ICD-10-CM | POA: Diagnosis not present

## 2018-12-02 DIAGNOSIS — Z0000 Encounter for general adult medical examination without abnormal findings: Secondary | ICD-10-CM | POA: Diagnosis not present

## 2018-12-02 DIAGNOSIS — M109 Gout, unspecified: Secondary | ICD-10-CM | POA: Diagnosis not present

## 2018-12-02 DIAGNOSIS — Z125 Encounter for screening for malignant neoplasm of prostate: Secondary | ICD-10-CM | POA: Diagnosis not present

## 2018-12-02 DIAGNOSIS — N529 Male erectile dysfunction, unspecified: Secondary | ICD-10-CM | POA: Diagnosis not present

## 2018-12-02 DIAGNOSIS — Z1159 Encounter for screening for other viral diseases: Secondary | ICD-10-CM | POA: Diagnosis not present

## 2018-12-02 DIAGNOSIS — E039 Hypothyroidism, unspecified: Secondary | ICD-10-CM | POA: Diagnosis not present

## 2019-01-02 DIAGNOSIS — E291 Testicular hypofunction: Secondary | ICD-10-CM | POA: Diagnosis not present

## 2019-01-03 DIAGNOSIS — J029 Acute pharyngitis, unspecified: Secondary | ICD-10-CM | POA: Diagnosis not present

## 2019-01-31 DIAGNOSIS — E291 Testicular hypofunction: Secondary | ICD-10-CM | POA: Diagnosis not present

## 2019-02-05 DIAGNOSIS — H6991 Unspecified Eustachian tube disorder, right ear: Secondary | ICD-10-CM | POA: Diagnosis not present

## 2019-03-03 DIAGNOSIS — E291 Testicular hypofunction: Secondary | ICD-10-CM | POA: Diagnosis not present

## 2019-03-03 DIAGNOSIS — M545 Low back pain: Secondary | ICD-10-CM | POA: Diagnosis not present

## 2019-03-25 DIAGNOSIS — M5136 Other intervertebral disc degeneration, lumbar region: Secondary | ICD-10-CM | POA: Diagnosis not present

## 2019-03-25 DIAGNOSIS — M5416 Radiculopathy, lumbar region: Secondary | ICD-10-CM | POA: Diagnosis not present

## 2019-03-25 DIAGNOSIS — M9903 Segmental and somatic dysfunction of lumbar region: Secondary | ICD-10-CM | POA: Diagnosis not present

## 2019-03-25 DIAGNOSIS — M5137 Other intervertebral disc degeneration, lumbosacral region: Secondary | ICD-10-CM | POA: Diagnosis not present

## 2019-03-28 DIAGNOSIS — M9903 Segmental and somatic dysfunction of lumbar region: Secondary | ICD-10-CM | POA: Diagnosis not present

## 2019-03-28 DIAGNOSIS — M5136 Other intervertebral disc degeneration, lumbar region: Secondary | ICD-10-CM | POA: Diagnosis not present

## 2019-03-28 DIAGNOSIS — M5137 Other intervertebral disc degeneration, lumbosacral region: Secondary | ICD-10-CM | POA: Diagnosis not present

## 2019-03-28 DIAGNOSIS — M5416 Radiculopathy, lumbar region: Secondary | ICD-10-CM | POA: Diagnosis not present

## 2019-04-01 DIAGNOSIS — M9903 Segmental and somatic dysfunction of lumbar region: Secondary | ICD-10-CM | POA: Diagnosis not present

## 2019-04-01 DIAGNOSIS — M5137 Other intervertebral disc degeneration, lumbosacral region: Secondary | ICD-10-CM | POA: Diagnosis not present

## 2019-04-01 DIAGNOSIS — M5416 Radiculopathy, lumbar region: Secondary | ICD-10-CM | POA: Diagnosis not present

## 2019-04-01 DIAGNOSIS — M5136 Other intervertebral disc degeneration, lumbar region: Secondary | ICD-10-CM | POA: Diagnosis not present

## 2019-04-02 DIAGNOSIS — E291 Testicular hypofunction: Secondary | ICD-10-CM | POA: Diagnosis not present

## 2019-04-04 DIAGNOSIS — M5137 Other intervertebral disc degeneration, lumbosacral region: Secondary | ICD-10-CM | POA: Diagnosis not present

## 2019-04-04 DIAGNOSIS — M9903 Segmental and somatic dysfunction of lumbar region: Secondary | ICD-10-CM | POA: Diagnosis not present

## 2019-04-04 DIAGNOSIS — M5416 Radiculopathy, lumbar region: Secondary | ICD-10-CM | POA: Diagnosis not present

## 2019-04-04 DIAGNOSIS — M5136 Other intervertebral disc degeneration, lumbar region: Secondary | ICD-10-CM | POA: Diagnosis not present

## 2019-04-09 DIAGNOSIS — M5137 Other intervertebral disc degeneration, lumbosacral region: Secondary | ICD-10-CM | POA: Diagnosis not present

## 2019-04-09 DIAGNOSIS — M9903 Segmental and somatic dysfunction of lumbar region: Secondary | ICD-10-CM | POA: Diagnosis not present

## 2019-04-09 DIAGNOSIS — M5136 Other intervertebral disc degeneration, lumbar region: Secondary | ICD-10-CM | POA: Diagnosis not present

## 2019-04-09 DIAGNOSIS — M5416 Radiculopathy, lumbar region: Secondary | ICD-10-CM | POA: Diagnosis not present

## 2019-04-18 DIAGNOSIS — M5136 Other intervertebral disc degeneration, lumbar region: Secondary | ICD-10-CM | POA: Diagnosis not present

## 2019-04-18 DIAGNOSIS — M5416 Radiculopathy, lumbar region: Secondary | ICD-10-CM | POA: Diagnosis not present

## 2019-04-18 DIAGNOSIS — M5137 Other intervertebral disc degeneration, lumbosacral region: Secondary | ICD-10-CM | POA: Diagnosis not present

## 2019-04-18 DIAGNOSIS — M9903 Segmental and somatic dysfunction of lumbar region: Secondary | ICD-10-CM | POA: Diagnosis not present

## 2019-04-25 DIAGNOSIS — M5137 Other intervertebral disc degeneration, lumbosacral region: Secondary | ICD-10-CM | POA: Diagnosis not present

## 2019-04-25 DIAGNOSIS — M5136 Other intervertebral disc degeneration, lumbar region: Secondary | ICD-10-CM | POA: Diagnosis not present

## 2019-04-25 DIAGNOSIS — M9903 Segmental and somatic dysfunction of lumbar region: Secondary | ICD-10-CM | POA: Diagnosis not present

## 2019-04-25 DIAGNOSIS — M5416 Radiculopathy, lumbar region: Secondary | ICD-10-CM | POA: Diagnosis not present

## 2019-05-02 DIAGNOSIS — E291 Testicular hypofunction: Secondary | ICD-10-CM | POA: Diagnosis not present

## 2019-05-13 DIAGNOSIS — Z7189 Other specified counseling: Secondary | ICD-10-CM | POA: Diagnosis not present

## 2019-06-02 DIAGNOSIS — E291 Testicular hypofunction: Secondary | ICD-10-CM | POA: Diagnosis not present

## 2019-06-18 ENCOUNTER — Other Ambulatory Visit: Payer: Self-pay | Admitting: Family Medicine

## 2019-06-18 DIAGNOSIS — R6889 Other general symptoms and signs: Secondary | ICD-10-CM | POA: Diagnosis not present

## 2019-06-18 DIAGNOSIS — Z20822 Contact with and (suspected) exposure to covid-19: Secondary | ICD-10-CM

## 2019-06-20 LAB — NOVEL CORONAVIRUS, NAA: SARS-CoV-2, NAA: NOT DETECTED

## 2019-07-03 DIAGNOSIS — H0102A Squamous blepharitis right eye, upper and lower eyelids: Secondary | ICD-10-CM | POA: Diagnosis not present

## 2019-07-03 DIAGNOSIS — H1045 Other chronic allergic conjunctivitis: Secondary | ICD-10-CM | POA: Diagnosis not present

## 2019-07-03 DIAGNOSIS — H16223 Keratoconjunctivitis sicca, not specified as Sjogren's, bilateral: Secondary | ICD-10-CM | POA: Diagnosis not present

## 2019-07-03 DIAGNOSIS — H0102B Squamous blepharitis left eye, upper and lower eyelids: Secondary | ICD-10-CM | POA: Diagnosis not present

## 2019-09-05 ENCOUNTER — Other Ambulatory Visit: Payer: Self-pay

## 2019-09-05 DIAGNOSIS — Z20828 Contact with and (suspected) exposure to other viral communicable diseases: Secondary | ICD-10-CM | POA: Diagnosis not present

## 2019-09-05 DIAGNOSIS — Z20822 Contact with and (suspected) exposure to covid-19: Secondary | ICD-10-CM

## 2019-09-07 LAB — NOVEL CORONAVIRUS, NAA: SARS-CoV-2, NAA: NOT DETECTED

## 2019-09-08 ENCOUNTER — Other Ambulatory Visit: Payer: Self-pay

## 2019-09-08 DIAGNOSIS — Z20828 Contact with and (suspected) exposure to other viral communicable diseases: Secondary | ICD-10-CM | POA: Diagnosis not present

## 2019-09-08 DIAGNOSIS — Z20822 Contact with and (suspected) exposure to covid-19: Secondary | ICD-10-CM

## 2019-09-09 LAB — NOVEL CORONAVIRUS, NAA: SARS-CoV-2, NAA: NOT DETECTED

## 2019-12-26 DIAGNOSIS — N529 Male erectile dysfunction, unspecified: Secondary | ICD-10-CM | POA: Diagnosis not present

## 2019-12-26 DIAGNOSIS — E039 Hypothyroidism, unspecified: Secondary | ICD-10-CM | POA: Diagnosis not present

## 2019-12-26 DIAGNOSIS — M25552 Pain in left hip: Secondary | ICD-10-CM | POA: Diagnosis not present

## 2019-12-26 DIAGNOSIS — Z125 Encounter for screening for malignant neoplasm of prostate: Secondary | ICD-10-CM | POA: Diagnosis not present

## 2019-12-26 DIAGNOSIS — Z0000 Encounter for general adult medical examination without abnormal findings: Secondary | ICD-10-CM | POA: Diagnosis not present

## 2019-12-30 DIAGNOSIS — M1612 Unilateral primary osteoarthritis, left hip: Secondary | ICD-10-CM | POA: Diagnosis not present

## 2019-12-31 ENCOUNTER — Ambulatory Visit: Payer: BC Managed Care – PPO | Attending: Internal Medicine

## 2019-12-31 DIAGNOSIS — Z20822 Contact with and (suspected) exposure to covid-19: Secondary | ICD-10-CM

## 2020-01-01 LAB — NOVEL CORONAVIRUS, NAA: SARS-CoV-2, NAA: NOT DETECTED

## 2020-01-23 DIAGNOSIS — E291 Testicular hypofunction: Secondary | ICD-10-CM | POA: Diagnosis not present

## 2020-01-23 DIAGNOSIS — Z1211 Encounter for screening for malignant neoplasm of colon: Secondary | ICD-10-CM | POA: Diagnosis not present

## 2020-01-25 DIAGNOSIS — Z20822 Contact with and (suspected) exposure to covid-19: Secondary | ICD-10-CM | POA: Diagnosis not present

## 2020-01-25 DIAGNOSIS — Z20828 Contact with and (suspected) exposure to other viral communicable diseases: Secondary | ICD-10-CM | POA: Diagnosis not present

## 2020-02-10 DIAGNOSIS — M25552 Pain in left hip: Secondary | ICD-10-CM | POA: Diagnosis not present

## 2020-02-16 DIAGNOSIS — Z01812 Encounter for preprocedural laboratory examination: Secondary | ICD-10-CM | POA: Diagnosis not present

## 2020-02-16 DIAGNOSIS — Z20822 Contact with and (suspected) exposure to covid-19: Secondary | ICD-10-CM | POA: Diagnosis not present

## 2020-02-18 DIAGNOSIS — Z79890 Hormone replacement therapy (postmenopausal): Secondary | ICD-10-CM | POA: Diagnosis not present

## 2020-02-18 DIAGNOSIS — N529 Male erectile dysfunction, unspecified: Secondary | ICD-10-CM | POA: Diagnosis not present

## 2020-02-18 DIAGNOSIS — J309 Allergic rhinitis, unspecified: Secondary | ICD-10-CM | POA: Diagnosis not present

## 2020-02-18 DIAGNOSIS — M109 Gout, unspecified: Secondary | ICD-10-CM | POA: Diagnosis not present

## 2020-02-18 DIAGNOSIS — F17210 Nicotine dependence, cigarettes, uncomplicated: Secondary | ICD-10-CM | POA: Diagnosis not present

## 2020-02-18 DIAGNOSIS — E039 Hypothyroidism, unspecified: Secondary | ICD-10-CM | POA: Diagnosis not present

## 2020-02-18 DIAGNOSIS — M1612 Unilateral primary osteoarthritis, left hip: Secondary | ICD-10-CM | POA: Diagnosis not present

## 2020-02-18 DIAGNOSIS — L409 Psoriasis, unspecified: Secondary | ICD-10-CM | POA: Diagnosis not present

## 2020-02-19 DIAGNOSIS — M1612 Unilateral primary osteoarthritis, left hip: Secondary | ICD-10-CM | POA: Diagnosis not present

## 2020-02-19 DIAGNOSIS — Z79890 Hormone replacement therapy (postmenopausal): Secondary | ICD-10-CM | POA: Diagnosis not present

## 2020-02-19 DIAGNOSIS — L409 Psoriasis, unspecified: Secondary | ICD-10-CM | POA: Diagnosis not present

## 2020-02-19 DIAGNOSIS — M109 Gout, unspecified: Secondary | ICD-10-CM | POA: Diagnosis not present

## 2020-02-19 DIAGNOSIS — F17210 Nicotine dependence, cigarettes, uncomplicated: Secondary | ICD-10-CM | POA: Diagnosis not present

## 2020-02-19 DIAGNOSIS — J309 Allergic rhinitis, unspecified: Secondary | ICD-10-CM | POA: Diagnosis not present

## 2020-02-19 DIAGNOSIS — N529 Male erectile dysfunction, unspecified: Secondary | ICD-10-CM | POA: Diagnosis not present

## 2020-02-19 DIAGNOSIS — E039 Hypothyroidism, unspecified: Secondary | ICD-10-CM | POA: Diagnosis not present

## 2020-02-25 DIAGNOSIS — M25552 Pain in left hip: Secondary | ICD-10-CM | POA: Diagnosis not present

## 2020-02-25 DIAGNOSIS — R600 Localized edema: Secondary | ICD-10-CM | POA: Diagnosis not present

## 2020-02-25 DIAGNOSIS — M25652 Stiffness of left hip, not elsewhere classified: Secondary | ICD-10-CM | POA: Diagnosis not present

## 2020-03-02 DIAGNOSIS — M25552 Pain in left hip: Secondary | ICD-10-CM | POA: Diagnosis not present

## 2020-03-02 DIAGNOSIS — M25652 Stiffness of left hip, not elsewhere classified: Secondary | ICD-10-CM | POA: Diagnosis not present

## 2020-03-02 DIAGNOSIS — R600 Localized edema: Secondary | ICD-10-CM | POA: Diagnosis not present

## 2020-03-04 ENCOUNTER — Ambulatory Visit: Payer: BC Managed Care – PPO | Attending: Internal Medicine

## 2020-03-04 DIAGNOSIS — Z23 Encounter for immunization: Secondary | ICD-10-CM

## 2020-03-04 DIAGNOSIS — M25652 Stiffness of left hip, not elsewhere classified: Secondary | ICD-10-CM | POA: Diagnosis not present

## 2020-03-04 DIAGNOSIS — R600 Localized edema: Secondary | ICD-10-CM | POA: Diagnosis not present

## 2020-03-04 DIAGNOSIS — M25552 Pain in left hip: Secondary | ICD-10-CM | POA: Diagnosis not present

## 2020-03-04 NOTE — Progress Notes (Signed)
   Covid-19 Vaccination Clinic  Name:  SABRIEL SAMBERG    MRN: [id number] DOB: [birth year]  03/04/2020  Mr. Stepka was observed post Covid-19 immunization for 15 minutes without incident. He was provided with Vaccine Information Sheet and instruction to access the V-Safe system.   Mr. Breakiron was instructed to call 911 with any severe reactions post vaccine: Marland Kitchen Difficulty breathing  . Swelling of face and throat  . A fast heartbeat  . A bad rash all over body  . Dizziness and weakness   Immunizations Administered    Name Date Dose VIS Date Route   Pfizer COVID-19 Vaccine 03/04/2020  4:23 PM 0.3 mL 10/31/2019 Intramuscular   Manufacturer: [place]   Lot: [id number]   [place]: [id number]

## 2020-03-09 DIAGNOSIS — R600 Localized edema: Secondary | ICD-10-CM | POA: Diagnosis not present

## 2020-03-09 DIAGNOSIS — M25652 Stiffness of left hip, not elsewhere classified: Secondary | ICD-10-CM | POA: Diagnosis not present

## 2020-03-09 DIAGNOSIS — M25552 Pain in left hip: Secondary | ICD-10-CM | POA: Diagnosis not present

## 2020-03-11 DIAGNOSIS — R600 Localized edema: Secondary | ICD-10-CM | POA: Diagnosis not present

## 2020-03-11 DIAGNOSIS — M25652 Stiffness of left hip, not elsewhere classified: Secondary | ICD-10-CM | POA: Diagnosis not present

## 2020-03-11 DIAGNOSIS — M25552 Pain in left hip: Secondary | ICD-10-CM | POA: Diagnosis not present

## 2020-03-16 DIAGNOSIS — R600 Localized edema: Secondary | ICD-10-CM | POA: Diagnosis not present

## 2020-03-16 DIAGNOSIS — M25552 Pain in left hip: Secondary | ICD-10-CM | POA: Diagnosis not present

## 2020-03-16 DIAGNOSIS — M25652 Stiffness of left hip, not elsewhere classified: Secondary | ICD-10-CM | POA: Diagnosis not present

## 2020-03-19 DIAGNOSIS — E291 Testicular hypofunction: Secondary | ICD-10-CM | POA: Diagnosis not present

## 2020-03-23 DIAGNOSIS — R600 Localized edema: Secondary | ICD-10-CM | POA: Diagnosis not present

## 2020-03-23 DIAGNOSIS — M25552 Pain in left hip: Secondary | ICD-10-CM | POA: Diagnosis not present

## 2020-03-23 DIAGNOSIS — M25652 Stiffness of left hip, not elsewhere classified: Secondary | ICD-10-CM | POA: Diagnosis not present

## 2020-03-26 DIAGNOSIS — R600 Localized edema: Secondary | ICD-10-CM | POA: Diagnosis not present

## 2020-03-26 DIAGNOSIS — M25552 Pain in left hip: Secondary | ICD-10-CM | POA: Diagnosis not present

## 2020-03-26 DIAGNOSIS — M25652 Stiffness of left hip, not elsewhere classified: Secondary | ICD-10-CM | POA: Diagnosis not present

## 2020-03-29 ENCOUNTER — Ambulatory Visit: Payer: BC Managed Care – PPO | Attending: Internal Medicine

## 2020-03-29 DIAGNOSIS — Z23 Encounter for immunization: Secondary | ICD-10-CM

## 2020-03-29 NOTE — Progress Notes (Signed)
   Covid-19 Vaccination Clinic  Name:  Brett Vaughn    MRN: [id number] DOB: 09-18-1961  03/29/2020  Mr. Brett Vaughn was observed post Covid-19 immunization for 15 minutes without incident. He was provided with Vaccine Information Sheet and instruction to access the V-Safe system.   Mr. Brett Vaughn was instructed to call 911 with any severe reactions post vaccine: Marland Kitchen Difficulty breathing  . Swelling of face and throat  . A fast heartbeat  . A bad rash all over body  . Dizziness and weakness   Immunizations Administered    Name Date Dose VIS Date Route   Pfizer COVID-19 Vaccine 03/29/2020  3:01 PM 0.3 mL 01/14/2019 Intramuscular   Manufacturer: [place]   Lot: [id number]   [place]: [id number]

## 2020-03-30 DIAGNOSIS — R600 Localized edema: Secondary | ICD-10-CM | POA: Diagnosis not present

## 2020-03-30 DIAGNOSIS — Z96642 Presence of left artificial hip joint: Secondary | ICD-10-CM | POA: Diagnosis not present

## 2020-03-30 DIAGNOSIS — M25652 Stiffness of left hip, not elsewhere classified: Secondary | ICD-10-CM | POA: Diagnosis not present

## 2020-03-30 DIAGNOSIS — M25552 Pain in left hip: Secondary | ICD-10-CM | POA: Diagnosis not present

## 2020-04-06 DIAGNOSIS — M25552 Pain in left hip: Secondary | ICD-10-CM | POA: Diagnosis not present

## 2020-04-06 DIAGNOSIS — R600 Localized edema: Secondary | ICD-10-CM | POA: Diagnosis not present

## 2020-04-06 DIAGNOSIS — M25652 Stiffness of left hip, not elsewhere classified: Secondary | ICD-10-CM | POA: Diagnosis not present

## 2020-04-08 DIAGNOSIS — M25552 Pain in left hip: Secondary | ICD-10-CM | POA: Diagnosis not present

## 2020-04-08 DIAGNOSIS — M25652 Stiffness of left hip, not elsewhere classified: Secondary | ICD-10-CM | POA: Diagnosis not present

## 2020-04-08 DIAGNOSIS — R600 Localized edema: Secondary | ICD-10-CM | POA: Diagnosis not present

## 2020-08-21 ENCOUNTER — Ambulatory Visit (HOSPITAL_COMMUNITY)
Admission: RE | Admit: 2020-08-21 | Discharge: 2020-08-21 | Disposition: A | Discharge status: Home / Self Care | Payer: BC Managed Care – PPO | Source: Ambulatory Visit | Attending: Pulmonary Disease | Admitting: Pulmonary Disease

## 2020-08-21 ENCOUNTER — Telehealth: Payer: Self-pay | Admitting: Nurse Practitioner

## 2020-08-21 ENCOUNTER — Other Ambulatory Visit: Payer: Self-pay | Admitting: Nurse Practitioner

## 2020-08-21 DIAGNOSIS — U071 COVID-19: Secondary | ICD-10-CM

## 2020-08-21 MED ORDER — CASIRIVIMAB-IMDEVIMAB 600-600 MG/10ML IJ SOLN
1200.0000 mg | Freq: Once | INTRAMUSCULAR | Status: AC
  Administered 2020-08-21: 1200 mg via INTRAVENOUS

## 2020-08-21 MED ORDER — EPINEPHRINE 0.3 MG/0.3ML IJ SOAJ: 0.3000 mg | Freq: Once | INTRAMUSCULAR | Status: DC | PRN

## 2020-08-21 MED ORDER — DIPHENHYDRAMINE HCL 50 MG/ML IJ SOLN: 50.0000 mg | Freq: Once | INTRAMUSCULAR | Status: DC | PRN

## 2020-08-21 MED ORDER — SODIUM CHLORIDE 0.9 % IV SOLN: INTRAVENOUS | Status: DC | PRN

## 2020-08-21 MED ORDER — METHYLPREDNISOLONE SODIUM SUCC 125 MG IJ SOLR: 125.0000 mg | Freq: Once | INTRAMUSCULAR | Status: DC | PRN

## 2020-08-21 MED ORDER — ALBUTEROL SULFATE HFA 108 (90 BASE) MCG/ACT IN AERS: 2.0000 | INHALATION_SPRAY | Freq: Once | RESPIRATORY_TRACT | Status: DC | PRN

## 2020-08-21 MED ORDER — FAMOTIDINE IN NACL 20-0.9 MG/50ML-% IV SOLN: 20.0000 mg | Freq: Once | INTRAVENOUS | Status: DC | PRN

## 2020-08-21 NOTE — Telephone Encounter (Signed)
Error

## 2020-08-21 NOTE — Progress Notes (Signed)
I connected by phone with Brett Vaughn on 08/21/2020 at 1:58 PM to discuss the potential use of a new treatment for mild to moderate COVID-19 viral infection in non-hospitalized patients.  This patient is a 59 y.o. male that meets the FDA criteria for Emergency Use Authorization of COVID monoclonal antibody casirivimab/imdevimab.  Has a (+) direct SARS-CoV-2 viral test result  Has mild or moderate COVID-19   Is NOT hospitalized due to COVID-19  Is within 10 days of symptom onset  Has at least one of the high risk factor(s) for progression to severe COVID-19 and/or hospitalization as defined in EUA.  Specific high risk criteria : BMI > 25   I have spoken and communicated the following to the patient or parent/caregiver regarding COVID monoclonal antibody treatment:  1. FDA has authorized the emergency use for the treatment of mild to moderate COVID-19 in adults and pediatric patients with positive results of direct SARS-CoV-2 viral testing who are 10 years of age and older weighing at least 40 kg, and who are at high risk for progressing to severe COVID-19 and/or hospitalization.  2. The significant known and potential risks and benefits of COVID monoclonal antibody, and the extent to which such potential risks and benefits are unknown.  3. Information on available alternative treatments and the risks and benefits of those alternatives, including clinical trials.  4. Patients treated with COVID monoclonal antibody should continue to self-isolate and use infection control measures (e.g., wear mask, isolate, social distance, avoid sharing personal items, clean and disinfect "high touch" surfaces, and frequent handwashing) according to CDC guidelines.   5. The patient or parent/caregiver has the option to accept or refuse COVID monoclonal antibody treatment.  After reviewing this information with the patient, the patient has agreed to receive one of the available covid 19 monoclonal  antibodies and will be provided an appropriate fact sheet prior to infusion.  Sx onset 08/12/20. Set up for infusion on Saturday 08/21/20 at 3:30 pm. Directions given to [facility]. Pt is aware that insurance will be charged an infusion fee.   Ranae Pila 08/21/2020 1:58 PM

## 2020-08-21 NOTE — Discharge Instructions (Signed)

## 2020-08-21 NOTE — Progress Notes (Signed)
  Diagnosis: COVID-19  Physician: Dr Joya Gaskins  Procedure: Covid Infusion Clinic Med: casirivimab\imdevimab infusion - Provided patient with casirivimab\imdevimab fact sheet for patients, parents and caregivers prior to infusion.  Complications: No immediate complications noted.  Discharge: Discharged home   Ashley Murrain 08/21/2020

## 2020-09-17 DIAGNOSIS — M109 Gout, unspecified: Secondary | ICD-10-CM | POA: Diagnosis not present

## 2020-09-17 DIAGNOSIS — F411 Generalized anxiety disorder: Secondary | ICD-10-CM | POA: Diagnosis not present

## 2020-12-23 DIAGNOSIS — H00021 Hordeolum internum right upper eyelid: Secondary | ICD-10-CM | POA: Diagnosis not present

## 2020-12-28 DIAGNOSIS — S80861A Insect bite (nonvenomous), right lower leg, initial encounter: Secondary | ICD-10-CM | POA: Diagnosis not present

## 2020-12-28 DIAGNOSIS — Z13220 Encounter for screening for lipoid disorders: Secondary | ICD-10-CM | POA: Diagnosis not present

## 2020-12-28 DIAGNOSIS — M109 Gout, unspecified: Secondary | ICD-10-CM | POA: Diagnosis not present

## 2020-12-28 DIAGNOSIS — Z8042 Family history of malignant neoplasm of prostate: Secondary | ICD-10-CM | POA: Diagnosis not present

## 2020-12-28 DIAGNOSIS — E291 Testicular hypofunction: Secondary | ICD-10-CM | POA: Diagnosis not present

## 2020-12-28 DIAGNOSIS — Z0000 Encounter for general adult medical examination without abnormal findings: Secondary | ICD-10-CM | POA: Diagnosis not present

## 2021-02-18 ENCOUNTER — Encounter (HOSPITAL_COMMUNITY): Payer: Self-pay

## 2021-02-18 ENCOUNTER — Emergency Department (HOSPITAL_COMMUNITY)
Admission: EM | Admit: 2021-02-18 | Discharge: 2021-02-18 | Disposition: A | Discharge status: Home / Self Care | Payer: BC Managed Care – PPO | Attending: Emergency Medicine | Admitting: Emergency Medicine

## 2021-02-18 ENCOUNTER — Emergency Department (HOSPITAL_COMMUNITY): Payer: BC Managed Care – PPO

## 2021-02-18 DIAGNOSIS — R110 Nausea: Secondary | ICD-10-CM | POA: Insufficient documentation

## 2021-02-18 DIAGNOSIS — R0600 Dyspnea, unspecified: Secondary | ICD-10-CM | POA: Diagnosis not present

## 2021-02-18 DIAGNOSIS — R202 Paresthesia of skin: Secondary | ICD-10-CM | POA: Insufficient documentation

## 2021-02-18 DIAGNOSIS — F17210 Nicotine dependence, cigarettes, uncomplicated: Secondary | ICD-10-CM | POA: Insufficient documentation

## 2021-02-18 DIAGNOSIS — R079 Chest pain, unspecified: Secondary | ICD-10-CM

## 2021-02-18 DIAGNOSIS — R12 Heartburn: Secondary | ICD-10-CM | POA: Insufficient documentation

## 2021-02-18 DIAGNOSIS — R609 Edema, unspecified: Secondary | ICD-10-CM | POA: Diagnosis not present

## 2021-02-18 DIAGNOSIS — R0789 Other chest pain: Secondary | ICD-10-CM | POA: Diagnosis not present

## 2021-02-18 HISTORY — DX: Presence of unspecified artificial hip joint: Z96.649

## 2021-02-18 HISTORY — DX: Bariatric surgery status: Z98.84

## 2021-02-18 LAB — CBC WITH DIFFERENTIAL/PLATELET
Abs Immature Granulocytes: 0.02 10*3/uL (ref 0.00–0.07)
Basophils Absolute: 0 10*3/uL (ref 0.0–0.1)
Basophils Relative: 1 %
Eosinophils Absolute: 0.1 10*3/uL (ref 0.0–0.5)
Eosinophils Relative: 3 %
HCT: 48.2 % (ref 39.0–52.0)
Hemoglobin: 16.9 g/dL (ref 13.0–17.0)
Immature Granulocytes: 0 %
Lymphocytes Relative: 27 %
Lymphs Abs: 1.4 10*3/uL (ref 0.7–4.0)
MCH: 35.1 pg — ABNORMAL HIGH (ref 26.0–34.0)
MCHC: 35.1 g/dL (ref 30.0–36.0)
MCV: 100 fL (ref 80.0–100.0)
Monocytes Absolute: 0.7 10*3/uL (ref 0.1–1.0)
Monocytes Relative: 13 %
Neutro Abs: 2.9 10*3/uL (ref 1.7–7.7)
Neutrophils Relative %: 56 %
Platelets: 159 10*3/uL (ref 150–400)
RBC: 4.82 MIL/uL (ref 4.22–5.81)
RDW: 13.9 % (ref 11.5–15.5)
WBC: 5.2 10*3/uL (ref 4.0–10.5)
nRBC: 0 % (ref 0.0–0.2)

## 2021-02-18 LAB — COMPREHENSIVE METABOLIC PANEL
ALT: 26 U/L (ref 0–44)
AST: 31 U/L (ref 15–41)
Albumin: 3.9 g/dL (ref 3.5–5.0)
Alkaline Phosphatase: 50 U/L (ref 38–126)
Anion gap: 9 (ref 5–15)
BUN: 5 mg/dL — ABNORMAL LOW (ref 8–23)
CO2: 24 mmol/L (ref 22–32)
Calcium: 9.2 mg/dL (ref 8.9–10.3)
Chloride: 98 mmol/L (ref 98–111)
Creatinine, Ser: 0.9 mg/dL (ref 0.61–1.24)
GFR, Estimated: >60 mL/min (ref >60)
Glucose, Bld: 98 mg/dL (ref 70–99)
Potassium: 4.3 mmol/L (ref 3.5–5.1)
Sodium: 131 mmol/L — ABNORMAL LOW (ref 135–145)
Total Bilirubin: 1.1 mg/dL (ref 0.3–1.2)
Total Protein: 6.6 g/dL (ref 6.5–8.1)

## 2021-02-18 LAB — TROPONIN I (HIGH SENSITIVITY): Troponin I (High Sensitivity): 3 ng/L (ref <18)

## 2021-02-18 NOTE — ED Provider Notes (Signed)
[facility] EMERGENCY DEPARTMENT Provider Note   CSN: [id number] Arrival date & time: 02/18/21  4580     History Chief Complaint  Patient presents with  . Chest Pain    Brett Vaughn is a 60 y.o. male.  HPI  Woke up at 0400 with left hip pain, not unusual since hip replacement last year Noticed feeling of central burning under sternum Took Tums which didn't help Felt clammy Around 0615 started to feel a little worse, started to have tingling in his left arm Continued for about 45-50 min Called wife and stated that he felt like he needed to vomit and didn't feel right Dry heaved, felt very clammy Called EMS who brought him in Symptoms now resolved Endorses some "shallow breathing" while this was happening Also felt very "jittery" and "just didn't feel right" No fevers, chills, diarrhea, chest pain  Has a history of lap band and occasionally has heartburn, but has never had this before Had chest pain in 2015, seen by cards then and tod was fine Smokes 1ppd x3 yrs, had previously quit for 30 yrs No alcohol or drug use Thinks his father had a heart attack at age 20, but not sure  No prior history of heart disease, HTN, HLD, DM  Past Medical History:  Diagnosis Date  . Chest pain   . Gout   . H/O hypogonadism   . History of hip replacement   . History of weight loss surgery   . Psoriasis     Patient Active Problem List   Diagnosis Date Noted  . Lapband APL 2009 03/25/2014  . Morbid obesity ([place]) 02/17/2014  . Chest pain   . H/O hypogonadism   . Gout   . Psoriasis     History reviewed. No pertinent surgical history.     Family History  Problem Relation Age of Onset  . Prostate cancer Other     Social History   Tobacco Use  . Smoking status: Current Every Day Smoker    Packs/day: 1.00    Types: Cigarettes  . Smokeless tobacco: Never Used  Vaping Use  . Vaping Use: Never used  Substance Use Topics  . Alcohol use: Yes     Alcohol/week: 10.0 standard drinks    Types: 10 Cans of beer per week    Comment: 10 beers daily  . Drug use: Not Currently    Home Medications Prior to Admission medications   Medication Sig Start Date End Date Taking? Authorizing Provider  CIALIS 20 MG tablet  04/08/14   [provider]  levothyroxine (SYNTHROID, LEVOTHROID) 88 MCG tablet Take 88 mcg by mouth daily before breakfast.    [provider]  Multiple Vitamin (MULTIVITAMIN) tablet Take 1 tablet by mouth daily.    [provider]  ondansetron (ZOFRAN) 4 MG tablet Take 1 tablet (4 mg total) by mouth every 8 (eight) hours as needed for nausea. 07/25/14   Michael Boston, MD  promethazine (PHENERGAN) 25 MG suppository Place 1 suppository (25 mg total) rectally every 6 (six) hours as needed for nausea. 07/25/14   Michael Boston, MD  tobramycin-dexamethasone [facility]) ophthalmic solution  01/14/14   [provider]    Allergies    Penicillins  Review of Systems   Review of Systems  Constitutional: Positive for diaphoresis. Negative for appetite change and fever.  HENT: Negative for congestion.   Eyes: Negative for visual disturbance.  Respiratory: Negative for chest tightness and shortness of breath.   Cardiovascular:  Negative for chest pain, palpitations and leg swelling.  Gastrointestinal: Positive for nausea and vomiting (dry heaves). Negative for abdominal pain, blood in stool, constipation and diarrhea.  Genitourinary: Negative for difficulty urinating and dysuria.  Musculoskeletal:       Left hip pain, chronic  Neurological: Negative for dizziness and syncope.    Physical Exam Updated Vital Signs BP 120/64   Pulse (!) 57   Temp 97.8 F (36.6 C) (Oral)   Resp 16   Ht 5\' 10"  (1.778 m)   Wt 113.4 kg   SpO2 97%   BMI 35.87 kg/m   Physical Exam Constitutional:      Appearance: He is obese.  HENT:     Head: Normocephalic and atraumatic.  Cardiovascular:     Rate and Rhythm: Normal  rate and regular rhythm.     Heart sounds: No murmur heard. No friction rub. No gallop.   Pulmonary:     Effort: Pulmonary effort is normal.     Breath sounds: Normal breath sounds. No wheezing, rhonchi or rales.  Chest:     Chest wall: No tenderness.  Abdominal:     General: Bowel sounds are normal.     Palpations: Abdomen is soft.     Tenderness: There is no abdominal tenderness. There is no guarding or rebound.  Musculoskeletal:     Right lower leg: Edema (trace) present.     Left lower leg: Edema (trace) present.  Skin:    General: Skin is warm and dry.  Neurological:     General: No focal deficit present.     Mental Status: He is alert and oriented to person, place, and time.  Psychiatric:        Mood and Affect: Mood normal.        Behavior: Behavior normal.     ED Results / Procedures / Treatments   Labs (all labs ordered are listed, but only abnormal results are displayed) Labs Reviewed  COMPREHENSIVE METABOLIC PANEL - Abnormal; Notable for the following components:      Result Value   Sodium 131 (*)    BUN 5 (*)    All other components within normal limits  CBC WITH DIFFERENTIAL/PLATELET - Abnormal; Notable for the following components:   MCH 35.1 (*)    All other components within normal limits  TROPONIN I (HIGH SENSITIVITY)  TROPONIN I (HIGH SENSITIVITY)    EKG None  Radiology DG CHEST PORT 1 VIEW  Result Date: 02/18/2021 CLINICAL DATA:  Chest pain. EXAM: PORTABLE CHEST 1 VIEW COMPARISON:  October 27, 2013. FINDINGS: The heart size and mediastinal contours are within normal limits. Both lungs are clear. No pneumothorax or pleural effusion is noted. The visualized skeletal structures are unremarkable. IMPRESSION: No active disease. Electronically Signed   By: Marijo Conception M.D.   On: 02/18/2021 09:18    Procedures Procedures   Medications Ordered in ED Medications - No data to display  ED Course  I have reviewed the triage vital signs and the  nursing notes.  Pertinent labs & imaging results that were available during my care of the patient were reviewed by me and considered in my medical decision making (see chart for details).    MDM Rules/Calculators/A&P                          60 yo patient presents with complaints of heartburn and left arm tingling associated with nausea and clamminess.  Low risk  for ACS, EKG WNL.  Exam and vitals are WNL.  He does not have significant risk factors aside from a possible family history of MI and smoking.  Reassuringly he is back to baseline.  Will obtain CMP, CBC, Trops, CXR and f/u results.  Given timing of pain, if initial trop negative and other workup normal, can d/c home with PCP f/u.  CXR without signs of acute disease.  Trop negative, in setting of timing of initial to presentation, was greater than 2 hrs and with low trop, no need to repeat.  CMP with mild hyponatremia, no other abnormalities on labs.  Heart score 2, low risk.  On further questioning, patient drinks about 3-8 beers a day, which could contribute to hyponatremia.  Recommended PCP f/u within 1 week and repeat BMP at that time.  Discussed ED precautions including worsening of symptoms without resolution, chest pain, difficulty breathing.  He voiced understanding.  He is okay with discharge.  Patient discharged home.   Final Clinical Impression(s) / ED Diagnoses Final diagnoses:  Chest pain    Rx / DC Orders ED Discharge Orders    None       [place], [place]ever[place], DO 02/18/21 1105    Elnora Morrison, MD 02/19/21 [phone]

## 2021-02-18 NOTE — ED Triage Notes (Signed)
Woke up with back pain, chest burning and felt like he was going to pass out. Pt was diaphoretic, n/v and clammy. Alert and oriented x 4.

## 2021-02-18 NOTE — Discharge Instructions (Addendum)
If your symptoms return and do not improve, please come back to tthe Emergency Room.  If you have chest pain, especially if it radiates down left arm, you are nauseous, you have shortness of breath please come back right away.  Limit your beer intake.

## 2021-02-21 DIAGNOSIS — K219 Gastro-esophageal reflux disease without esophagitis: Secondary | ICD-10-CM | POA: Diagnosis not present

## 2021-02-21 DIAGNOSIS — R079 Chest pain, unspecified: Secondary | ICD-10-CM | POA: Diagnosis not present

## 2021-02-21 DIAGNOSIS — E039 Hypothyroidism, unspecified: Secondary | ICD-10-CM | POA: Diagnosis not present

## 2021-02-21 DIAGNOSIS — F411 Generalized anxiety disorder: Secondary | ICD-10-CM | POA: Diagnosis not present

## 2021-02-21 DIAGNOSIS — E871 Hypo-osmolality and hyponatremia: Secondary | ICD-10-CM | POA: Diagnosis not present

## 2021-03-21 DIAGNOSIS — F411 Generalized anxiety disorder: Secondary | ICD-10-CM | POA: Diagnosis not present

## 2021-08-18 DIAGNOSIS — J069 Acute upper respiratory infection, unspecified: Secondary | ICD-10-CM | POA: Diagnosis not present

## 2021-08-22 ENCOUNTER — Other Ambulatory Visit: Payer: Self-pay

## 2021-08-22 ENCOUNTER — Ambulatory Visit: Payer: BC Managed Care – PPO | Admitting: Dermatology

## 2021-08-22 ENCOUNTER — Encounter: Payer: Self-pay | Admitting: Dermatology

## 2021-08-22 ENCOUNTER — Other Ambulatory Visit: Payer: Self-pay | Admitting: Dermatology

## 2021-08-22 DIAGNOSIS — D485 Neoplasm of uncertain behavior of skin: Secondary | ICD-10-CM

## 2021-08-22 DIAGNOSIS — W57XXXA Bitten or stung by nonvenomous insect and other nonvenomous arthropods, initial encounter: Secondary | ICD-10-CM | POA: Diagnosis not present

## 2021-08-22 DIAGNOSIS — S80862A Insect bite (nonvenomous), left lower leg, initial encounter: Secondary | ICD-10-CM | POA: Diagnosis not present

## 2021-08-22 DIAGNOSIS — L570 Actinic keratosis: Secondary | ICD-10-CM

## 2021-08-22 DIAGNOSIS — L720 Epidermal cyst: Secondary | ICD-10-CM | POA: Diagnosis not present

## 2021-08-22 DIAGNOSIS — L430 Hypertrophic lichen planus: Secondary | ICD-10-CM | POA: Diagnosis not present

## 2021-08-22 MED ORDER — CLOBETASOL PROP EMOLLIENT BASE 0.05 % EX CREA: TOPICAL_CREAM | CUTANEOUS | 6 refills | Status: DC

## 2021-08-22 MED ORDER — TOLAK 4 % EX CREA: TOPICAL_CREAM | CUTANEOUS | 1 refills | Status: DC

## 2021-08-22 NOTE — Patient Instructions (Signed)

## 2021-09-03 ENCOUNTER — Encounter: Payer: Self-pay | Admitting: Dermatology

## 2021-09-03 NOTE — Progress Notes (Signed)
   New Patient   Subjective  Brett Vaughn is a 60 y.o. male who presents for the following: Cyst (On back- "growoing and starting to bother me").  General skin examination, several spots to check Location:  Duration:  Quality:  Associated Signs/Symptoms: Modifying Factors:  Severity:  Timing: Context:    The following portions of the chart were reviewed this encounter and updated as appropriate:  Tobacco  Allergies  Meds  Problems  Med Hx  Surg Hx  Fam Hx      Objective  Well appearing patient in no apparent distress; mood and affect are within normal limits. Left Lower Leg - Posterior Inflamed 5 mm pink papule compatible with right  Mid Back Noninflamed 1 cm dermal cyst  Scalp Multiple small pink gritty crusts  Left Proximal 3rd Finger Waxy 5 mm crust with superficial erosion         A full examination was performed including scalp, head, eyes, ears, nose, lips, neck, chest, axillae, abdomen, back, buttocks, bilateral upper extremities, bilateral lower extremities, hands, feet, fingers, toes, fingernails, and toenails. All findings within normal limits unless otherwise noted below.   Assessment & Plan  Bug bite without infection, initial encounter Left Lower Leg - Posterior  Topical clobetasol twice daily for 1 week  Related Medications Clobetasol Prop Emollient Base (CLOBETASOL PROPIONATE E) 0.05 % emollient cream Apply to affected area wd- not for face, folds or groin  Epidermal cyst Mid Back  Patient may choose to schedule surgery  AK (actinic keratosis) Scalp  Topical Tolak in the winter for 28 applications.  Fluorouracil (TOLAK) 4 % CREA - Scalp Apply to affected area qd x 25-28 appications  Neoplasm of uncertain behavior of skin Left Proximal 3rd Finger  Skin / nail biopsy Type of biopsy: tangential   Informed consent: discussed and consent obtained   Timeout: patient name, date of birth, surgical site, and procedure verified    Procedure prep:  Patient was prepped and draped in usual sterile fashion (Non sterile) Prep type:  Chlorhexidine Anesthesia: the lesion was anesthetized in a standard fashion   Anesthetic:  1% lidocaine w/ epinephrine 1-100,000 local infiltration Instrument used: flexible razor blade   Outcome: patient tolerated procedure well   Post-procedure details: wound care instructions given    Specimen 1 - Surgical pathology Differential Diagnosis: bcc vs scc  Check Margins: No

## 2021-10-18 DIAGNOSIS — Z20822 Contact with and (suspected) exposure to covid-19: Secondary | ICD-10-CM | POA: Diagnosis not present

## 2021-11-17 ENCOUNTER — Encounter: Payer: Self-pay | Admitting: Dermatology

## 2021-11-17 ENCOUNTER — Ambulatory Visit (INDEPENDENT_AMBULATORY_CARE_PROVIDER_SITE_OTHER): Payer: BC Managed Care – PPO | Admitting: Dermatology

## 2021-11-17 ENCOUNTER — Other Ambulatory Visit: Payer: Self-pay

## 2021-11-17 DIAGNOSIS — L720 Epidermal cyst: Secondary | ICD-10-CM | POA: Diagnosis not present

## 2021-11-17 DIAGNOSIS — L570 Actinic keratosis: Secondary | ICD-10-CM | POA: Diagnosis not present

## 2021-11-17 DIAGNOSIS — L409 Psoriasis, unspecified: Secondary | ICD-10-CM | POA: Diagnosis not present

## 2021-11-17 DIAGNOSIS — W57XXXA Bitten or stung by nonvenomous insect and other nonvenomous arthropods, initial encounter: Secondary | ICD-10-CM | POA: Diagnosis not present

## 2021-11-17 MED ORDER — CLOBETASOL PROP EMOLLIENT BASE 0.05 % EX CREA: TOPICAL_CREAM | CUTANEOUS | 6 refills | Status: DC

## 2021-11-17 MED ORDER — TRIAMCINOLONE ACETONIDE 40 MG/ML IJ SUSP
40.0000 mg | Freq: Once | INTRAMUSCULAR | Status: AC
  Administered 2021-11-17: 08:00:00 40 mg

## 2021-11-17 NOTE — Patient Instructions (Signed)

## 2021-12-18 ENCOUNTER — Encounter: Payer: Self-pay | Admitting: Dermatology

## 2021-12-18 NOTE — Progress Notes (Signed)
° °  Follow-Up Visit   Subjective  Brett Vaughn is a 61 y.o. male who presents for the following: Procedure (Here for cyst removal on back).  Requests excision of cyst on back, follow-up psoriasis Location:  Duration:  Quality:  Associated Signs/Symptoms: Modifying Factors:  Severity:  Timing: Context:   Objective  Well appearing patient in no apparent distress; mood and affect are within normal limits. Left Hand - Posterior, Right Hand - Posterior Treated with Tolak, no significant residual actinic keratoses   Left Lower Leg - Anterior, Right Lower Leg - Anterior Small plaque disease.   2.2cm noninflamed deep dermal nodule; patient requests removal.  Told preoperatively of risks of recurrence, unsightly scarring, noncoverage by health insurance.  Mid Back 1 DEEP STITCH 4 TOP STITCH         All skin waist up examined.   Assessment & Plan    Bug bite without infection, initial encounter  Related Medications Clobetasol Prop Emollient Base (CLOBETASOL PROPIONATE E) 0.05 % emollient cream APPLY TO AFFECTED AREA QD- NOT FOR FACE, FOLDS OR GROIN  Actinic keratosis (2) Left Hand - Posterior; Right Hand - Posterior  Recheck as needed change  Psoriasis Left Lower Leg - Anterior; Right Lower Leg - Anterior  Use clobetasol for actively inflamed spots daily after bathing for 4 weeks, avoid use on face and body folds.  triamcinolone acetonide (KENALOG-40) injection 40 mg - Left Lower Leg - Anterior, Right Lower Leg - Anterior   Intralesional injection - Left Lower Leg - Anterior, Right Lower Leg - Anterior Location: left & right leg  Informed Consent: Discussed risks (infection, pain, bleeding, bruising, thinning of the skin, loss of skin pigment, lack of resolution, and recurrence of lesion) and benefits of the procedure, as well as the alternatives. Informed consent was obtained. Preparation: The area was prepared a standard fashion.   Procedure Details:  An intralesional injection was performed with Kenalog 40 mg/cc. 0.1 cc in total were injected.  Total number of injections: 3  NDC # [phone]  Plan: The patient was instructed on post-op care. Recommend OTC analgesia as needed for pain.   Epidermoid cyst Mid Back  Elliptical incision, dissected out, visibly clear, layered closure.  Skin excision - Mid Back  Lesion length (cm):  2.2 Lesion width (cm):  2.2 Margin per side (cm):  0 Total excision diameter (cm):  2.2 Informed consent: discussed and consent obtained   Timeout: patient name, date of birth, surgical site, and procedure verified   Anesthesia: the lesion was anesthetized in a standard fashion   Anesthetic:  1% lidocaine w/ epinephrine 1-100,000 local infiltration Instrument used: #15 blade   Hemostasis achieved with: pressure and electrodesiccation   Outcome: patient tolerated procedure well with no complications   Post-procedure details: sterile dressing applied and wound care instructions given   Dressing type: bandage, petrolatum and pressure dressing    Specimen 1 - Surgical pathology Differential Diagnosis: R/O CYST   Check Margins: No      I, Lavonna Monarch, MD, have reviewed all documentation for this visit.  The documentation on 12/18/21 for the exam, diagnosis, procedures, and orders are all accurate and complete.

## 2021-12-19 ENCOUNTER — Ambulatory Visit: Payer: BC Managed Care – PPO | Admitting: Dermatology

## 2021-12-19 ENCOUNTER — Other Ambulatory Visit: Payer: Self-pay

## 2021-12-19 DIAGNOSIS — L570 Actinic keratosis: Secondary | ICD-10-CM

## 2021-12-19 MED ORDER — AMINOLEVULINIC ACID HCL 10 % EX GEL
2000.0000 mg | Freq: Once | CUTANEOUS | Status: AC
  Administered 2021-12-19: 2000 mg via TOPICAL

## 2021-12-19 NOTE — Patient Instructions (Signed)

## 2022-01-07 ENCOUNTER — Encounter: Payer: Self-pay | Admitting: Dermatology

## 2022-01-07 NOTE — Progress Notes (Signed)
° °  Follow-Up Visit   Subjective  Brett Vaughn is a 61 y.o. male who presents for the following: Photodynamic Therapy (Pt here for first PDT to forehead and top scalp. ).  Actinic keratoses Location:  Duration:  Quality:  Associated Signs/Symptoms: Modifying Factors:  Severity:  Timing: Context:   Objective  Well appearing patient in no apparent distress; mood and affect are within normal limits. Left Temple, Right Temple, Scalp, Upper Face Multiple nonhypertrophic actinic keratoses scalp and forehead    A focused examination was performed including head and neck. Relevant physical exam findings are noted in the Assessment and Plan.   Assessment & Plan    Actinic keratosis (4) Scalp; Upper Face; Left Temple; Right Temple  Photodynamic therapy - Left Temple, Right Temple, Scalp, Upper Face Procedure discussed: discussed risks, benefits, side effects. and alternatives   Prep: site scrubbed/prepped with acetone   Debridement: done by dr Mataya Kilduff.   Location:  Upper face, B/L temples, top scalp Type of treatment:  Blue light Aminolevulinic Acid (see MAR for details): Ameluz Amount of Ameluz (mg):  2000 Incubation time (minutes):  90 Number of minutes under lamp:  16 Number of seconds under lamp:  42 Cooling:  Fan and floor fan Outcome: patient tolerated procedure well with no complications   Post-procedure details: sunscreen applied and aftercare instructions given to patient    Related Medications Aminolevulinic Acid HCl 10 % GEL 2,000 mg       I, Lavonna Monarch, MD, have reviewed all documentation for this visit.  The documentation on 01/07/22 for the exam, diagnosis, procedures, and orders are all accurate and complete.

## 2022-01-09 DIAGNOSIS — Z8042 Family history of malignant neoplasm of prostate: Secondary | ICD-10-CM | POA: Diagnosis not present

## 2022-01-09 DIAGNOSIS — E291 Testicular hypofunction: Secondary | ICD-10-CM | POA: Diagnosis not present

## 2022-01-09 DIAGNOSIS — Z0000 Encounter for general adult medical examination without abnormal findings: Secondary | ICD-10-CM | POA: Diagnosis not present

## 2022-01-17 DIAGNOSIS — Z1211 Encounter for screening for malignant neoplasm of colon: Secondary | ICD-10-CM | POA: Diagnosis not present

## 2022-01-17 DIAGNOSIS — E291 Testicular hypofunction: Secondary | ICD-10-CM | POA: Diagnosis not present

## 2022-03-13 ENCOUNTER — Encounter: Payer: Self-pay | Admitting: Dermatology

## 2022-03-13 ENCOUNTER — Ambulatory Visit: Payer: BC Managed Care – PPO | Admitting: Dermatology

## 2022-03-13 DIAGNOSIS — B079 Viral wart, unspecified: Secondary | ICD-10-CM

## 2022-03-13 DIAGNOSIS — L570 Actinic keratosis: Secondary | ICD-10-CM

## 2022-03-13 DIAGNOSIS — D044 Carcinoma in situ of skin of scalp and neck: Secondary | ICD-10-CM | POA: Diagnosis not present

## 2022-03-13 DIAGNOSIS — L700 Acne vulgaris: Secondary | ICD-10-CM

## 2022-03-13 DIAGNOSIS — C4492 Squamous cell carcinoma of skin, unspecified: Secondary | ICD-10-CM

## 2022-03-13 DIAGNOSIS — D485 Neoplasm of uncertain behavior of skin: Secondary | ICD-10-CM

## 2022-03-13 HISTORY — DX: Squamous cell carcinoma of skin, unspecified: C44.92

## 2022-03-13 MED ORDER — TRETINOIN 0.025 % EX CREA: TOPICAL_CREAM | Freq: Every day | CUTANEOUS | 3 refills | Status: DC

## 2022-03-20 ENCOUNTER — Telehealth: Payer: Self-pay

## 2022-03-20 NOTE — Telephone Encounter (Signed)
-----   Message from Lavonna Monarch, MD sent at 03/17/2022  7:01 AM EDT ----- ?Schedule for curettage of scalp lesion, can be one half surgical visit. ?

## 2022-03-20 NOTE — Telephone Encounter (Signed)
Phone call to patient with his pathology results. Patient aware of results.  

## 2022-03-28 ENCOUNTER — Encounter: Payer: Self-pay | Admitting: Dermatology

## 2022-03-28 NOTE — Progress Notes (Signed)
? ?Follow-Up Visit ?  ?Subjective  ?Brett Vaughn is a 61 y.o. male who presents for the following: Follow-up (Pdt on face and scalp- had good peel). ? ?Follow-up actinic keratoses/PDT, check spost on scalp and leg ?Location:  ?Duration:  ?Quality:  ?Associated Signs/Symptoms: ?Modifying Factors:  ?Severity:  ?Timing: ?Context:  ? ?Objective  ?Well appearing patient in no apparent distress; mood and affect are within normal limits. ?Right Thigh - Anterior ?Verrucous 5 mm papule ? ? ? ? ?Mid frontal Scalp ?7 mm waxy crust ? ? ? ? ? ? ?Mid Forehead (2), Mid Parietal Scalp, Right Mid Helix ?Roughly 70% reduction in actinic keratoses.  1 possible uncovered skin cancer frontal scalp will be biopsied. ? ? ? ?A focused examination was performed including head, neck, leg.. Relevant physical exam findings are noted in the Assessment and Plan. ? ? ?Assessment & Plan  ? ? ?Neoplasm of uncertain behavior of skin (2) ?Right Thigh - Anterior ? ?Skin / nail biopsy ?Type of biopsy: tangential   ?Informed consent: discussed and consent obtained   ?Timeout: patient name, date of birth, surgical site, and procedure verified   ?Procedure prep:  Patient was prepped and draped in usual sterile fashion (Non sterile) ?Prep type:  Chlorhexidine ?Anesthesia: the lesion was anesthetized in a standard fashion   ?Anesthetic:  1% lidocaine w/ epinephrine 1-100,000 local infiltration ?Instrument used: flexible razor blade   ?Outcome: patient tolerated procedure well   ?Post-procedure details: wound care instructions given   ? ?Destruction of lesion ?Complexity: simple   ?Destruction method: electrodesiccation and curettage   ?Informed consent: discussed and consent obtained   ?Timeout:  patient name, date of birth, surgical site, and procedure verified ?Anesthesia: the lesion was anesthetized in a standard fashion   ?Anesthetic:  1% lidocaine w/ epinephrine 1-100,000 local infiltration ?Curettage performed in three different directions: Yes    ?Curettage cycles:  3 ?Lesion length (cm):  1 ?Lesion width (cm):  1 ?Margin per side (cm):  0 ?Final wound size (cm):  1 ?Hemostasis achieved with:  aluminum chloride ?Outcome: patient tolerated procedure well with no complications   ?Post-procedure details: wound care instructions given   ? ?Specimen 1 - Surgical pathology ?Differential Diagnosis: bcc vs scc, wart-txpbx ? ?Check Margins: No ? ?Mid frontal Scalp ? ?Skin / nail biopsy ?Type of biopsy: tangential   ?Informed consent: discussed and consent obtained   ?Timeout: patient name, date of birth, surgical site, and procedure verified   ?Procedure prep:  Patient was prepped and draped in usual sterile fashion (Non sterile) ?Prep type:  Chlorhexidine ?Anesthesia: the lesion was anesthetized in a standard fashion   ?Anesthetic:  1% lidocaine w/ epinephrine 1-100,000 local infiltration ?Instrument used: flexible razor blade   ?Outcome: patient tolerated procedure well   ?Post-procedure details: wound care instructions given   ? ?Specimen 2 - Surgical pathology ?Differential Diagnosis: bcc vs scc ? ?Check Margins: No ? ?After shave biopsy of the leg lesion, the base was curetted and cauterized.  We will await the biopsy results on the scalp before any other treatment. ? ?AK (actinic keratosis) (4) ?Right Mid Helix; Mid Parietal Scalp; Mid Forehead (2) ? ?Recheck in the late fall or winter to decide if any further field therapy is indicated. ? ?Destruction of lesion - Mid Parietal Scalp, Right Mid Helix ?Complexity: simple   ?Destruction method: cryotherapy   ?Informed consent: discussed and consent obtained   ?Timeout:  patient name, date of birth, surgical site, and procedure verified ?Lesion destroyed  using liquid nitrogen: Yes   ?Cryotherapy cycles:  3 ?Outcome: patient tolerated procedure well with no complications   ? ?Related Medications ?Fluorouracil (TOLAK) 4 % CREA ?Apply to affected area qd x 25-28 appications ? ?Acne vulgaris ?Right Malar  Cheek ? ?Related Medications ?tretinoin (RETIN-A) 0.025 % cream ?Apply topically at bedtime. Apply to acne qhs ? ? ? ? ? ?I, Lavonna Monarch, MD, have reviewed all documentation for this visit.  The documentation on 03/28/22 for the exam, diagnosis, procedures, and orders are all accurate and complete. ?

## 2022-05-04 ENCOUNTER — Ambulatory Visit (INDEPENDENT_AMBULATORY_CARE_PROVIDER_SITE_OTHER): Payer: BC Managed Care – PPO | Admitting: Dermatology

## 2022-05-04 ENCOUNTER — Encounter: Payer: Self-pay | Admitting: Dermatology

## 2022-05-04 DIAGNOSIS — L570 Actinic keratosis: Secondary | ICD-10-CM

## 2022-05-04 DIAGNOSIS — D044 Carcinoma in situ of skin of scalp and neck: Secondary | ICD-10-CM

## 2022-05-04 NOTE — Patient Instructions (Signed)

## 2022-05-28 ENCOUNTER — Encounter: Payer: Self-pay | Admitting: Dermatology

## 2022-05-28 NOTE — Progress Notes (Signed)
   Follow-Up Visit   Subjective  Brett Vaughn is a 61 y.o. male who presents for the following: Procedure (Here for treatment- mid frontal scalp- cis x 1).  Biopsy-proven carcinoma in situ scalp, also check small crusts on arms Location:  Duration:  Quality:  Associated Signs/Symptoms: Modifying Factors:  Severity:  Timing: Context:   Objective  Well appearing patient in no apparent distress; mood and affect are within normal limits. Mid Frontal Scalp Biopsy site identified by nurse and me.  Right Dorsal Hand (2), Right Forearm - Anterior 3 gritty 2 to 3 mm crust    A focused examination was performed including head, neck, arms. Relevant physical exam findings are noted in the Assessment and Plan.   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  Destruction of lesion Complexity: simple   Destruction method: electrodesiccation and curettage   Informed consent: discussed and consent obtained   Timeout:  patient name, date of birth, surgical site, and procedure verified Anesthesia: the lesion was anesthetized in a standard fashion   Anesthetic:  1% lidocaine w/ epinephrine 1-100,000 local infiltration Curettage performed in three different directions: Yes   Electrodesiccation performed over the curetted area: Yes   Curettage cycles:  3 Lesion length (cm):  1.8 Lesion width (cm):  1.8 Margin per side (cm):  0 Final wound size (cm):  1.8 Hemostasis achieved with:  aluminum chloride Outcome: patient tolerated procedure well with no complications   Post-procedure details: wound care instructions given    AK (actinic keratosis) (3) Right Forearm - Anterior; Right Dorsal Hand (2)  Destruction of lesion - Right Dorsal Hand, Right Forearm - Anterior Complexity: simple   Destruction method: cryotherapy   Informed consent: discussed and consent obtained   Timeout:  patient name, date of birth, surgical site, and procedure  verified Lesion destroyed using liquid nitrogen: Yes   Cryotherapy cycles:  3 Outcome: patient tolerated procedure well with no complications    Related Medications Fluorouracil (TOLAK) 4 % CREA Apply to affected area qd x 25-28 appications      I, Lavonna Monarch, MD, have reviewed all documentation for this visit.  The documentation on 05/28/22 for the exam, diagnosis, procedures, and orders are all accurate and complete.

## 2022-07-12 DIAGNOSIS — M7061 Trochanteric bursitis, right hip: Secondary | ICD-10-CM | POA: Diagnosis not present

## 2022-08-18 DIAGNOSIS — Z9884 Bariatric surgery status: Secondary | ICD-10-CM | POA: Diagnosis not present

## 2022-08-18 DIAGNOSIS — K219 Gastro-esophageal reflux disease without esophagitis: Secondary | ICD-10-CM | POA: Diagnosis not present

## 2022-08-18 DIAGNOSIS — Z4651 Encounter for fitting and adjustment of gastric lap band: Secondary | ICD-10-CM | POA: Diagnosis not present

## 2022-08-30 DIAGNOSIS — Z9884 Bariatric surgery status: Secondary | ICD-10-CM | POA: Diagnosis not present

## 2022-08-30 DIAGNOSIS — Z4651 Encounter for fitting and adjustment of gastric lap band: Secondary | ICD-10-CM | POA: Diagnosis not present

## 2022-09-01 DIAGNOSIS — J069 Acute upper respiratory infection, unspecified: Secondary | ICD-10-CM | POA: Diagnosis not present

## 2022-10-11 DIAGNOSIS — Z9884 Bariatric surgery status: Secondary | ICD-10-CM | POA: Diagnosis not present

## 2022-10-11 DIAGNOSIS — Z4651 Encounter for fitting and adjustment of gastric lap band: Secondary | ICD-10-CM | POA: Diagnosis not present

## 2022-11-24 DIAGNOSIS — Z4651 Encounter for fitting and adjustment of gastric lap band: Secondary | ICD-10-CM | POA: Diagnosis not present

## 2022-11-30 DIAGNOSIS — M549 Dorsalgia, unspecified: Secondary | ICD-10-CM | POA: Diagnosis not present

## 2022-12-08 ENCOUNTER — Ambulatory Visit
Admission: RE | Admit: 2022-12-08 | Discharge: 2022-12-08 | Disposition: A | Discharge status: Home / Self Care | Payer: BC Managed Care – PPO | Source: Ambulatory Visit | Attending: Orthopedic Surgery | Admitting: Orthopedic Surgery

## 2022-12-08 ENCOUNTER — Other Ambulatory Visit: Payer: Self-pay | Admitting: Orthopedic Surgery

## 2022-12-08 ENCOUNTER — Other Ambulatory Visit: Payer: Self-pay

## 2022-12-08 DIAGNOSIS — R2242 Localized swelling, mass and lump, left lower limb: Secondary | ICD-10-CM

## 2022-12-08 DIAGNOSIS — M79662 Pain in left lower leg: Secondary | ICD-10-CM | POA: Diagnosis not present

## 2022-12-08 DIAGNOSIS — M7989 Other specified soft tissue disorders: Secondary | ICD-10-CM | POA: Diagnosis not present

## 2022-12-08 DIAGNOSIS — M170 Bilateral primary osteoarthritis of knee: Secondary | ICD-10-CM | POA: Diagnosis not present

## 2022-12-18 DIAGNOSIS — Z9884 Bariatric surgery status: Secondary | ICD-10-CM | POA: Diagnosis not present

## 2022-12-29 DIAGNOSIS — M25562 Pain in left knee: Secondary | ICD-10-CM | POA: Diagnosis not present

## 2023-01-03 DIAGNOSIS — Z0189 Encounter for other specified special examinations: Secondary | ICD-10-CM | POA: Diagnosis not present

## 2023-01-03 DIAGNOSIS — M25562 Pain in left knee: Secondary | ICD-10-CM | POA: Diagnosis not present

## 2023-01-08 DIAGNOSIS — S83207A Unspecified tear of unspecified meniscus, current injury, left knee, initial encounter: Secondary | ICD-10-CM | POA: Diagnosis not present

## 2023-01-10 ENCOUNTER — Encounter: Payer: Self-pay | Admitting: Orthopedic Surgery

## 2023-01-10 ENCOUNTER — Other Ambulatory Visit: Payer: Self-pay | Admitting: Orthopedic Surgery

## 2023-01-10 ENCOUNTER — Encounter
Admission: RE | Admit: 2023-01-10 | Discharge: 2023-01-10 | Disposition: A | Discharge status: Home / Self Care | Payer: BC Managed Care – PPO | Source: Ambulatory Visit | Attending: Orthopedic Surgery | Admitting: Orthopedic Surgery

## 2023-01-10 VITALS — Ht 70.5 in | Wt 310.0 lb

## 2023-01-10 DIAGNOSIS — E039 Hypothyroidism, unspecified: Secondary | ICD-10-CM

## 2023-01-10 DIAGNOSIS — Z01812 Encounter for preprocedural laboratory examination: Secondary | ICD-10-CM

## 2023-01-10 DIAGNOSIS — Z8639 Personal history of other endocrine, nutritional and metabolic disease: Secondary | ICD-10-CM

## 2023-01-10 DIAGNOSIS — Z01810 Encounter for preprocedural cardiovascular examination: Secondary | ICD-10-CM

## 2023-01-10 HISTORY — DX: Herpesviral infection of urogenital system, unspecified: A60.00

## 2023-01-10 HISTORY — DX: Hypothyroidism, unspecified: E03.9

## 2023-01-10 NOTE — Patient Instructions (Addendum)
Your procedure is scheduled on:01-16-23 Tuesday Report to the Registration Desk on the 1st floor of the [facility].Then proceed to the 2nd floor Surgery Desk To find out your arrival time, please call [phone] between 1PM - 3PM on:01-15-23 Monday If your arrival time is 6:00 am, do not arrive before that time as the [facility] entrance doors do not open until 6:00 am.  REMEMBER: Instructions that are not followed completely may result in serious medical risk, up to and including death; or upon the discretion of your surgeon and anesthesiologist your surgery may need to be rescheduled.  Do not eat food OR drink any liquids after midnight the night before surgery.  No gum chewing or hard candies  One week prior to surgery: Stop Anti-inflammatories (NSAIDS) such as Advil, Aleve, Ibuprofen, Motrin, Naproxen, Naprosyn and Aspirin based products such as Excedrin, Goody's Powder, BC Powder.You may however, take Tylenol if needed for pain up until the day of surgery.  Stop ANY OVER THE COUNTER supplements/vitamins NOW (01-10-23) until after surgery (Multivitamin)   TAKE ONLY THESE MEDICATIONS THE MORNING OF SURGERY WITH A SIP OF WATER: -allopurinol (ZYLOPRIM)  -busPIRone (BUSPAR)  -levothyroxine (SYNTHROID)  -valACYclovir HCl (VALTREX )  No Alcohol for 24 hours before or after surgery.  No Smoking including e-cigarettes for 24 hours before surgery.  No chewable tobacco products for at least 6 hours before surgery.  No nicotine patches on the day of surgery.  Do not use any "recreational" drugs for at least a week (preferably 2 weeks) before your surgery.  Please be advised that the combination of cocaine and anesthesia may have negative outcomes, up to and including death. If you test positive for cocaine, your surgery will be cancelled.  On the morning of surgery brush your teeth with toothpaste and water, you may rinse your mouth with mouthwash if you wish. Do not swallow any  toothpaste or mouthwash.  Use CHG Soap as directed on instruction sheet.  Do not wear jewelry, make-up, hairpins, clips or nail polish.  Do not wear lotions, powders, or perfumes.   Do not shave body hair from the neck down 48 hours before surgery.  Contact lenses, hearing aids and dentures may not be worn into surgery.  Do not bring valuables to the hospital. [facility] is not responsible for any missing/lost belongings or valuables. .   Notify your doctor if there is any change in your medical condition (cold, fever, infection).  Wear comfortable clothing (specific to your surgery type) to the hospital.  After surgery, you can help prevent lung complications by doing breathing exercises.  Take deep breaths and cough every 1-2 hours. Your doctor may order a device called an Incentive Spirometer to help you take deep breaths. When coughing or sneezing, hold a pillow firmly against your incision with both hands. This is called "splinting." Doing this helps protect your incision. It also decreases belly discomfort.  If you are being admitted to the hospital overnight, leave your suitcase in the car. After surgery it may be brought to your room.  In case of increased patient census, it may be necessary for you, the patient, to continue your postoperative care in the Same Day Surgery department.  If you are being discharged the day of surgery, you will not be allowed to drive home. You will need a responsible individual to drive you home and stay with you for 24 hours after surgery.   If you are taking public transportation, you will need to  have a responsible individual with you.  Please call the [facility] Dept. at [phone] if you have any questions about these instructions.  Surgery Visitation Policy:  Patients undergoing a surgery or procedure may have two family members or support persons with them as long as the person is not COVID-19 positive or experiencing  its symptoms.   Due to an increase in RSV and influenza rates and associated hospitalizations, children ages 20 and under will not be able to visit patients in [facility]. Masks continue to be strongly recommended.     Preparing for Surgery with CHLORHEXIDINE GLUCONATE (CHG) Soap  Chlorhexidine Gluconate (CHG) Soap  o An antiseptic cleaner that kills germs and bonds with the skin to continue killing germs even after washing  o Used for showering the night before surgery and morning of surgery  Before surgery, you can play an important role by reducing the number of germs on your skin.  CHG (Chlorhexidine gluconate) soap is an antiseptic cleanser which kills germs and bonds with the skin to continue killing germs even after washing.  Please do not use if you have an allergy to CHG or antibacterial soaps. If your skin becomes reddened/irritated stop using the CHG.  1. Shower the NIGHT BEFORE SURGERY and the MORNING OF SURGERY with CHG soap.  2. If you choose to wash your hair, wash your hair first as usual with your normal shampoo.  3. After shampooing, rinse your hair and body thoroughly to remove the shampoo.  4. Use CHG as you would any other liquid soap. You can apply CHG directly to the skin and wash gently with a scrungie or a clean washcloth.  5. Apply the CHG soap to your body only from the neck down. Do not use on open wounds or open sores. Avoid contact with your eyes, ears, mouth, and genitals (private parts). Wash face and genitals (private parts) with your normal soap.  6. Wash thoroughly, paying special attention to the area where your surgery will be performed.  7. Thoroughly rinse your body with warm water.  8. Do not shower/wash with your normal soap after using and rinsing off the CHG soap.  9. Pat yourself dry with a clean towel.  10. Wear clean pajamas to bed the night before surgery.  12. Place clean sheets on your bed the night of your first  shower and do not sleep with pets.  13. Shower again with the CHG soap on the day of surgery prior to arriving at the hospital.  14. Do not apply any deodorants/lotions/powders.  15. Please wear clean clothes to the hospital.  How to Use an Incentive Spirometer An incentive spirometer is a tool that measures how well you are filling your lungs with each breath. Learning to take long, deep breaths using this tool can help you keep your lungs clear and active. This may help to reverse or lessen your chance of developing breathing (pulmonary) problems, especially infection. You may be asked to use a spirometer: After a surgery. If you have a lung problem or a history of smoking. After a long period of time when you have been unable to move or be active. If the spirometer includes an indicator to show the highest number that you have reached, your health care provider or respiratory therapist will help you set a goal. Keep a log of your progress as told by your health care provider. What are the risks? Breathing too quickly may cause dizziness or cause  you to pass out. Take your time so you do not get dizzy or light-headed. If you are in pain, you may need to take pain medicine before doing incentive spirometry. It is harder to take a deep breath if you are having pain. How to use your incentive spirometer  Sit up on the edge of your bed or on a chair. Hold the incentive spirometer so that it is in an upright position. Before you use the spirometer, breathe out normally. Place the mouthpiece in your mouth. Make sure your lips are closed tightly around it. Breathe in slowly and as deeply as you can through your mouth, causing the piston or the ball to rise toward the top of the chamber. Hold your breath for 3-5 seconds, or for as long as possible. If the spirometer includes a coach indicator, use this to guide you in breathing. Slow down your breathing if the indicator goes above the marked  areas. Remove the mouthpiece from your mouth and breathe out normally. The piston or ball will return to the bottom of the chamber. Rest for a few seconds, then repeat the steps 10 or more times. Take your time and take a few normal breaths between deep breaths so that you do not get dizzy or light-headed. Do this every 1-2 hours when you are awake. If the spirometer includes a goal marker to show the highest number you have reached (best effort), use this as a goal to work toward during each repetition. After each set of 10 deep breaths, cough a few times. This will help to make sure that your lungs are clear. If you have an incision on your chest or abdomen from surgery, place a pillow or a rolled-up towel firmly against the incision when you cough. This can help to reduce pain while taking deep breaths and coughing. General tips When you are able to get out of bed: Walk around often. Continue to take deep breaths and cough in order to clear your lungs. Keep using the incentive spirometer until your health care provider says it is okay to stop using it. If you have been in the hospital, you may be told to keep using the spirometer at home. Contact a health care provider if: You are having difficulty using the spirometer. You have trouble using the spirometer as often as instructed. Your pain medicine is not giving enough relief for you to use the spirometer as told. You have a fever. Get help right away if: You develop shortness of breath. You develop a cough with bloody mucus from the lungs. You have fluid or blood coming from an incision site after you cough. Summary An incentive spirometer is a tool that can help you learn to take long, deep breaths to keep your lungs clear and active. You may be asked to use a spirometer after a surgery, if you have a lung problem or a history of smoking, or if you have been inactive for a long period of time. Use your incentive spirometer as  instructed every 1-2 hours while you are awake. If you have an incision on your chest or abdomen, place a pillow or a rolled-up towel firmly against your incision when you cough. This will help to reduce pain. Get help right away if you have shortness of breath, you cough up bloody mucus, or blood comes from your incision when you cough. This information is not intended to replace advice given to you by your health care provider. Make sure you  discuss any questions you have with your health care provider. Document Revised: 01/26/2020 Document Reviewed: 01/26/2020 Elsevier Patient Education  [place].

## 2023-01-10 NOTE — H&P (Deleted)
  The note originally documented on this encounter has been moved the the encounter in which it belongs.  

## 2023-01-10 NOTE — H&P (Signed)
Brett Vaughn MRN:  [id number] DOB/SEX:  10/13/1961/male  CHIEF COMPLAINT:  Painful left Knee  HISTORY: Patient is a 61 y.o. male presented with a history of pain in the left knee. Onset of symptoms was abrupt starting several months ago with gradually worsening course since that time. Prior procedures on the knee include none. Patient has been treated conservatively with over-the-counter NSAIDs and activity modification. Patient currently rates pain in the knee at 9 out of 10 with activity. There is no pain at night.  PAST MEDICAL HISTORY: Patient Active Problem List   Diagnosis Date Noted   Lapband APL 2009 03/25/2014   Morbid obesity (HCC) 02/17/2014   Chest pain    H/O hypogonadism    Gout    Psoriasis    Past Medical History:  Diagnosis Date   Chest pain    Gout    H/O hypogonadism    History of hip replacement    History of weight loss surgery    Psoriasis    Squamous cell carcinoma of skin 03/13/2022   Mid Frontal Scalp (in situ)(CX3)   No past surgical history on file.   MEDICATIONS:  (Not in a hospital admission)   ALLERGIES:   Allergies  Allergen Reactions   Doxycycline Hyclate     Other reaction(s): nausea   Levitra [Vardenafil]     Other reaction(s): flushing   Penicillins    Sertraline Hcl     Other reaction(s): dizziness, confusion   Sildenafil     Other reaction(s): headache    REVIEW OF SYSTEMS:  Pertinent items are noted in HPI.   FAMILY HISTORY:   Family History  Problem Relation Age of Onset   Prostate cancer Other     SOCIAL HISTORY:   Social History   Tobacco Use   Smoking status: Every Day    Packs/day: 1.00    Types: Cigarettes   Smokeless tobacco: Never  Substance Use Topics   Alcohol use: Yes    Alcohol/week: 10.0 standard drinks of alcohol    Types: 10 Cans of beer per week    Comment: 10 beers daily     EXAMINATION:  Vital signs in last 24 hours: @VSRANGES@  General appearance: alert, cooperative, and no  distress Neck: no JVD and supple, symmetrical, trachea midline Lungs: clear to auscultation bilaterally Heart: regular rate and rhythm, S1, S2 normal, no murmur, click, rub or gallop Abdomen: soft, non-tender; bowel sounds normal; no masses,  no organomegaly Extremities: extremities normal, atraumatic, no cyanosis or edema and Homans sign is negative, no sign of DVT Pulses: 2+ and symmetric Skin: Skin color, texture, turgor normal. No rashes or lesions Neurologic: Alert and oriented X 3, normal strength and tone. Normal symmetric reflexes. Normal coordination and gait  Musculoskeletal:  ROM 0-110, Ligaments intact,  Imaging Review Meniscal tear  Assessment/Plan: Left knee internal derangement  Left knee arthroscopy  Brett Vaughn 01/10/2023, 6:45 AM  

## 2023-01-12 ENCOUNTER — Encounter
Admission: RE | Admit: 2023-01-12 | Discharge: 2023-01-12 | Disposition: A | Discharge status: Home / Self Care | Payer: BC Managed Care – PPO | Source: Ambulatory Visit | Attending: Orthopedic Surgery | Admitting: Orthopedic Surgery

## 2023-01-12 DIAGNOSIS — Z01818 Encounter for other preprocedural examination: Secondary | ICD-10-CM | POA: Insufficient documentation

## 2023-01-12 DIAGNOSIS — Z01812 Encounter for preprocedural laboratory examination: Secondary | ICD-10-CM

## 2023-01-12 DIAGNOSIS — E039 Hypothyroidism, unspecified: Secondary | ICD-10-CM | POA: Insufficient documentation

## 2023-01-12 DIAGNOSIS — Z01810 Encounter for preprocedural cardiovascular examination: Secondary | ICD-10-CM | POA: Diagnosis not present

## 2023-01-12 LAB — BASIC METABOLIC PANEL
Anion gap: 10 (ref 5–15)
BUN: 19 mg/dL (ref 8–23)
CO2: 23 mmol/L (ref 22–32)
Calcium: 8.7 mg/dL — ABNORMAL LOW (ref 8.9–10.3)
Chloride: 99 mmol/L (ref 98–111)
Creatinine, Ser: 0.91 mg/dL (ref 0.61–1.24)
GFR, Estimated: >60 mL/min (ref >60)
Glucose, Bld: 102 mg/dL — ABNORMAL HIGH (ref 70–99)
Potassium: 4 mmol/L (ref 3.5–5.1)
Sodium: 132 mmol/L — ABNORMAL LOW (ref 135–145)

## 2023-01-12 LAB — CBC
HCT: 46.2 % (ref 39.0–52.0)
Hemoglobin: 16 g/dL (ref 13.0–17.0)
MCH: 34.2 pg — ABNORMAL HIGH (ref 26.0–34.0)
MCHC: 34.6 g/dL (ref 30.0–36.0)
MCV: 98.7 fL (ref 80.0–100.0)
Platelets: 140 10*3/uL — ABNORMAL LOW (ref 150–400)
RBC: 4.68 MIL/uL (ref 4.22–5.81)
RDW: 13.1 % (ref 11.5–15.5)
WBC: 9.1 10*3/uL (ref 4.0–10.5)
nRBC: 0 % (ref 0.0–0.2)

## 2023-01-15 MED ORDER — LACTATED RINGERS IV SOLN: INTRAVENOUS | Status: DC

## 2023-01-15 MED ORDER — CEFAZOLIN SODIUM-DEXTROSE 2-4 GM/100ML-% IV SOLN
2.0000 g | INTRAVENOUS | Status: AC
  Administered 2023-01-16: 2 g via INTRAVENOUS

## 2023-01-15 MED ORDER — CHLORHEXIDINE GLUCONATE 0.12 % MT SOLN: 15.0000 mL | Freq: Once | OROMUCOSAL | Status: AC

## 2023-01-15 MED ORDER — ORAL CARE MOUTH RINSE: 15.0000 mL | Freq: Once | OROMUCOSAL | Status: AC

## 2023-01-15 MED ORDER — FAMOTIDINE 20 MG PO TABS: 20.0000 mg | ORAL_TABLET | Freq: Once | ORAL | Status: AC

## 2023-01-16 ENCOUNTER — Encounter
Admission: RE | Disposition: A | Discharge status: Home / Self Care | Payer: Self-pay | Source: Home / Self Care | Attending: Orthopedic Surgery

## 2023-01-16 ENCOUNTER — Ambulatory Visit
Admission: RE | Admit: 2023-01-16 | Discharge: 2023-01-16 | Disposition: A | Discharge status: Home / Self Care | Payer: BC Managed Care – PPO | Attending: Orthopedic Surgery | Admitting: Orthopedic Surgery

## 2023-01-16 ENCOUNTER — Ambulatory Visit: Payer: BC Managed Care – PPO | Admitting: Anesthesiology

## 2023-01-16 ENCOUNTER — Other Ambulatory Visit: Payer: Self-pay

## 2023-01-16 ENCOUNTER — Encounter: Payer: Self-pay | Admitting: Orthopedic Surgery

## 2023-01-16 DIAGNOSIS — Z09 Encounter for follow-up examination after completed treatment for conditions other than malignant neoplasm: Secondary | ICD-10-CM | POA: Diagnosis not present

## 2023-01-16 DIAGNOSIS — S83207A Unspecified tear of unspecified meniscus, current injury, left knee, initial encounter: Secondary | ICD-10-CM | POA: Insufficient documentation

## 2023-01-16 DIAGNOSIS — M94262 Chondromalacia, left knee: Secondary | ICD-10-CM | POA: Diagnosis not present

## 2023-01-16 DIAGNOSIS — Z87891 Personal history of nicotine dependence: Secondary | ICD-10-CM | POA: Diagnosis not present

## 2023-01-16 DIAGNOSIS — E039 Hypothyroidism, unspecified: Secondary | ICD-10-CM | POA: Diagnosis not present

## 2023-01-16 DIAGNOSIS — Z6841 Body Mass Index (BMI) 40.0 and over, adult: Secondary | ICD-10-CM | POA: Insufficient documentation

## 2023-01-16 DIAGNOSIS — X58XXXA Exposure to other specified factors, initial encounter: Secondary | ICD-10-CM | POA: Insufficient documentation

## 2023-01-16 DIAGNOSIS — S83282A Other tear of lateral meniscus, current injury, left knee, initial encounter: Secondary | ICD-10-CM | POA: Diagnosis not present

## 2023-01-16 DIAGNOSIS — S83242A Other tear of medial meniscus, current injury, left knee, initial encounter: Secondary | ICD-10-CM | POA: Diagnosis not present

## 2023-01-16 HISTORY — PX: KNEE ARTHROSCOPY WITH MEDIAL MENISECTOMY: SHX5651

## 2023-01-16 SURGERY — ARTHROSCOPY, KNEE, WITH MEDIAL MENISCECTOMY
Anesthesia: General | Site: Knee | Laterality: Left

## 2023-01-16 MED ORDER — ROPIVACAINE HCL 5 MG/ML IJ SOLN
INTRAMUSCULAR | Status: DC | PRN
  Administered 2023-01-16: 21 mL via INTRAMUSCULAR

## 2023-01-16 MED ORDER — EPINEPHRINE PF 1 MG/ML IJ SOLN
INTRAMUSCULAR | Status: AC
  Filled 2023-01-16: qty 2

## 2023-01-16 MED ORDER — ROPIVACAINE HCL 5 MG/ML IJ SOLN
INTRAMUSCULAR | Status: AC
  Filled 2023-01-16: qty 20

## 2023-01-16 MED ORDER — FENTANYL CITRATE (PF) 100 MCG/2ML IJ SOLN
INTRAMUSCULAR | Status: DC | PRN
  Administered 2023-01-16 (×3): 50 ug via INTRAVENOUS

## 2023-01-16 MED ORDER — LACTATED RINGERS IR SOLN
Status: DC | PRN
  Administered 2023-01-16: 3000 mL

## 2023-01-16 MED ORDER — FENTANYL CITRATE (PF) 100 MCG/2ML IJ SOLN
INTRAMUSCULAR | Status: AC
  Filled 2023-01-16: qty 2

## 2023-01-16 MED ORDER — DEXMEDETOMIDINE HCL IN NACL 200 MCG/50ML IV SOLN
INTRAVENOUS | Status: DC | PRN
  Administered 2023-01-16 (×2): 8 ug via INTRAVENOUS

## 2023-01-16 MED ORDER — PROPOFOL 10 MG/ML IV BOLUS
INTRAVENOUS | Status: AC
  Filled 2023-01-16: qty 20

## 2023-01-16 MED ORDER — ACETAMINOPHEN 10 MG/ML IV SOLN
INTRAVENOUS | Status: AC
  Filled 2023-01-16: qty 100

## 2023-01-16 MED ORDER — CEFAZOLIN SODIUM-DEXTROSE 2-4 GM/100ML-% IV SOLN
INTRAVENOUS | Status: AC
  Filled 2023-01-16: qty 100

## 2023-01-16 MED ORDER — LACTATED RINGERS IR SOLN
Status: DC | PRN
  Administered 2023-01-16 (×2): 3001 mL

## 2023-01-16 MED ORDER — PROPOFOL 10 MG/ML IV BOLUS
INTRAVENOUS | Status: DC | PRN
  Administered 2023-01-16: 200 mg via INTRAVENOUS

## 2023-01-16 MED ORDER — KETOROLAC TROMETHAMINE 15 MG/ML IJ SOLN
15.0000 mg | Freq: Four times a day (QID) | INTRAMUSCULAR | Status: DC
  Administered 2023-01-16: 15 mg via INTRAVENOUS

## 2023-01-16 MED ORDER — OXYCODONE HCL 5 MG PO TABS
ORAL_TABLET | ORAL | Status: AC
  Filled 2023-01-16: qty 1

## 2023-01-16 MED ORDER — OXYCODONE HCL 5 MG/5ML PO SOLN: 5.0000 mg | Freq: Once | ORAL | Status: AC | PRN

## 2023-01-16 MED ORDER — METHYLPREDNISOLONE ACETATE 40 MG/ML IJ SUSP
INTRAMUSCULAR | Status: AC
  Filled 2023-01-16: qty 1

## 2023-01-16 MED ORDER — ONDANSETRON HCL 4 MG/2ML IJ SOLN
INTRAMUSCULAR | Status: DC | PRN
  Administered 2023-01-16: 4 mg via INTRAVENOUS

## 2023-01-16 MED ORDER — DEXMEDETOMIDINE HCL IN NACL 80 MCG/20ML IV SOLN
INTRAVENOUS | Status: AC
  Filled 2023-01-16: qty 20

## 2023-01-16 MED ORDER — BUPIVACAINE-EPINEPHRINE (PF) 0.25% -1:200000 IJ SOLN
INTRAMUSCULAR | Status: DC | PRN
  Administered 2023-01-16: 5 mL

## 2023-01-16 MED ORDER — EPINEPHRINE PF 1 MG/ML IJ SOLN
INTRAMUSCULAR | Status: AC
  Filled 2023-01-16: qty 1

## 2023-01-16 MED ORDER — OXYCODONE HCL 5 MG PO TABS
5.0000 mg | ORAL_TABLET | Freq: Once | ORAL | Status: AC | PRN
  Administered 2023-01-16: 5 mg via ORAL

## 2023-01-16 MED ORDER — WATER FOR IRRIGATION, STERILE IR SOLN: Status: DC | PRN

## 2023-01-16 MED ORDER — ACETAMINOPHEN 10 MG/ML IV SOLN
INTRAVENOUS | Status: DC | PRN
  Administered 2023-01-16: 1000 mg via INTRAVENOUS

## 2023-01-16 MED ORDER — HYDROMORPHONE HCL 1 MG/ML IJ SOLN
0.2500 mg | INTRAMUSCULAR | Status: DC | PRN
  Administered 2023-01-16 (×2): 0.5 mg via INTRAVENOUS

## 2023-01-16 MED ORDER — HYDROMORPHONE HCL 1 MG/ML IJ SOLN
INTRAMUSCULAR | Status: AC
  Filled 2023-01-16: qty 1

## 2023-01-16 MED ORDER — MIDAZOLAM HCL 2 MG/2ML IJ SOLN
INTRAMUSCULAR | Status: AC
  Filled 2023-01-16: qty 2

## 2023-01-16 MED ORDER — LACTATED RINGERS IV SOLN: INTRAVENOUS | Status: DC

## 2023-01-16 MED ORDER — FAMOTIDINE 20 MG PO TABS
ORAL_TABLET | ORAL | Status: AC
  Administered 2023-01-16: 20 mg via ORAL
  Filled 2023-01-16: qty 1

## 2023-01-16 MED ORDER — DEXAMETHASONE SODIUM PHOSPHATE 10 MG/ML IJ SOLN
INTRAMUSCULAR | Status: DC | PRN
  Administered 2023-01-16: 10 mg via INTRAVENOUS

## 2023-01-16 MED ORDER — BUPIVACAINE HCL (PF) 0.25 % IJ SOLN
INTRAMUSCULAR | Status: AC
  Filled 2023-01-16: qty 30

## 2023-01-16 MED ORDER — CHLORHEXIDINE GLUCONATE 0.12 % MT SOLN
OROMUCOSAL | Status: AC
  Administered 2023-01-16: 15 mL via OROMUCOSAL
  Filled 2023-01-16: qty 15

## 2023-01-16 MED ORDER — MIDAZOLAM HCL 2 MG/2ML IJ SOLN
INTRAMUSCULAR | Status: DC | PRN
  Administered 2023-01-16: 2 mg via INTRAVENOUS

## 2023-01-16 MED ORDER — KETOROLAC TROMETHAMINE 15 MG/ML IJ SOLN
INTRAMUSCULAR | Status: AC
  Filled 2023-01-16: qty 1

## 2023-01-16 SURGICAL SUPPLY — 43 items
ADAPTER IRRIG TUBE 2 SPIKE SOL (ADAPTER) ×2 IMPLANT
ADPR TBG 2 SPK PMP STRL ASCP (ADAPTER)
APL PRP STRL LF DISP 70% ISPRP (MISCELLANEOUS) ×1
BLADE FULL RADIUS 3.5 (BLADE) IMPLANT
BLADE INCISOR PLUS 4.5 (BLADE) ×1 IMPLANT
BLADE SHAVER 4.5 DBL SERAT CV (CUTTER) IMPLANT
BLADE SURG SZ11 CARB STEEL (BLADE) ×1 IMPLANT
BRUSH SCRUB EZ  4% CHG (MISCELLANEOUS) ×1
BRUSH SCRUB EZ 4% CHG (MISCELLANEOUS) ×2 IMPLANT
CHLORAPREP W/TINT 26 (MISCELLANEOUS) ×1 IMPLANT
COOLER POLAR GLACIER W/PUMP (MISCELLANEOUS) ×1 IMPLANT
DRAPE ARTHRO LIMB 89X125 STRL (DRAPES) ×1 IMPLANT
DRAPE ORTHO SPLIT 77X108 STRL (DRAPES) ×1
DRAPE SURG ORHT 6 SPLT 77X108 (DRAPES) ×1 IMPLANT
GAUZE SPONGE 4X4 12PLY STRL (GAUZE/BANDAGES/DRESSINGS) ×1 IMPLANT
GAUZE XEROFORM 1X8 LF (GAUZE/BANDAGES/DRESSINGS) ×1 IMPLANT
GLOVE BIOGEL PI IND STRL 8.5 (GLOVE) ×1 IMPLANT
GLOVE SURG ORTHO 8.5 STRL (GLOVE) ×1 IMPLANT
GOWN STRL REUS W/ TWL LRG LVL3 (GOWN DISPOSABLE) ×1 IMPLANT
GOWN STRL REUS W/ TWL XL LVL3 (GOWN DISPOSABLE) ×1 IMPLANT
GOWN STRL REUS W/TWL LRG LVL3 (GOWN DISPOSABLE) ×1
GOWN STRL REUS W/TWL XL LVL3 (GOWN DISPOSABLE) ×1
IV LACTATED RINGER IRRG 3000ML (IV SOLUTION) ×3
IV LR IRRIG 3000ML ARTHROMATIC (IV SOLUTION) ×4 IMPLANT
KIT TURNOVER KIT A (KITS) ×1 IMPLANT
MANIFOLD NEPTUNE II (INSTRUMENTS) ×2 IMPLANT
MAT ABSORB  FLUID 56X50 GRAY (MISCELLANEOUS) ×1
MAT ABSORB FLUID 56X50 GRAY (MISCELLANEOUS) ×1 IMPLANT
NDL SPNL 20GX3.5 QUINCKE YW (NEEDLE) ×1 IMPLANT
NEEDLE SPNL 20GX3.5 QUINCKE YW (NEEDLE) ×1 IMPLANT
PACK ARTHROSCOPY KNEE (MISCELLANEOUS) ×1 IMPLANT
PAD ABD DERMACEA PRESS 5X9 (GAUZE/BANDAGES/DRESSINGS) ×2 IMPLANT
PAD WRAPON POLAR KNEE (MISCELLANEOUS) ×1 IMPLANT
SLEEVE REMOTE CONTROL 5X12 (DRAPES) IMPLANT
SPONGE T-LAP 18X18 ~~LOC~~+RFID (SPONGE) ×1 IMPLANT
SUT ETHILON 4-0 (SUTURE) ×1
SUT ETHILON 4-0 FS2 18XMFL BLK (SUTURE) ×1
SUTURE ETHLN 4-0 FS2 18XMF BLK (SUTURE) ×1 IMPLANT
TRAP FLUID SMOKE EVACUATOR (MISCELLANEOUS) ×1 IMPLANT
TUBE SET DOUBLEFLO INFLOW (TUBING) ×1 IMPLANT
WAND WEREWOLF FLOW 90D (MISCELLANEOUS) ×1 IMPLANT
WATER STERILE IRR 500ML POUR (IV SOLUTION) ×1 IMPLANT
WRAPON POLAR PAD KNEE (MISCELLANEOUS) ×1

## 2023-01-16 NOTE — Anesthesia Postprocedure Evaluation (Signed)
Anesthesia Post Note  Patient: Brett Vaughn  Procedure(s) Performed: Left Knee Arthroscopic with Partial Medial and Lateral Meniscectomy, Chondroplasty, and Partial Synovectomy (Left: Knee)  Patient location during evaluation: PACU Anesthesia Type: General Level of consciousness: awake and alert Pain management: pain level controlled Vital Signs Assessment: post-procedure vital signs reviewed and stable Respiratory status: spontaneous breathing, nonlabored ventilation, respiratory function stable and patient connected to nasal cannula oxygen Cardiovascular status: blood pressure returned to baseline and stable Postop Assessment: no apparent nausea or vomiting Anesthetic complications: no   No notable events documented.   Last Vitals:  Vitals:   01/16/23 1034 01/16/23 1317  BP: (!) 143/85 (!) 153/83  Pulse: 79 83  Resp: 20 14  Temp: 37 C 36.9 C  SpO2: 95% 94%    Last Pain:  Vitals:   01/16/23 1317  TempSrc:   PainSc: Asleep                 Ilene Qua

## 2023-01-16 NOTE — Transfer of Care (Signed)
Immediate Anesthesia Transfer of Care Note  Patient: Brett Vaughn  Procedure(s) Performed: Left Knee Arthroscopic with Partial Medial and Lateral Meniscectomy, Chondroplasty, and Partial Synovectomy (Left: Knee)  Patient Location: PACU  Anesthesia Type:General  Level of Consciousness: drowsy and patient cooperative  Airway & Oxygen Therapy: Patient Spontanous Breathing and Patient connected to face mask oxygen  Post-op Assessment: Report given to RN and Post -op Vital signs reviewed and stable  Post vital signs: Reviewed and stable  Last Vitals:  Vitals Value Taken Time  BP 153/86 01/16/23 1416  Temp 36.7 C 01/16/23 1416  Pulse 60 01/16/23 1416  Resp 14 01/16/23 1416  SpO2 95 % 01/16/23 1416    Last Pain:  Vitals:   01/16/23 1416  TempSrc: Tympanic  PainSc: 2          Complications: No notable events documented.

## 2023-01-16 NOTE — Discharge Instructions (Addendum)
Post Op Home Instructions for Knee Arthroscopy  PATIENT ALREADY HAS FOLLOW-UP, PAIN MEDS AND PHYSICAL THERAPY SCHEDULED  1) Do not sit for longer than 1 hour at a time with your leg dangling down.  You should have your legs elevated (higher than your heart) in a recliner chair or couch.  2) You may be up walking around as tolerated but should take periodic breaks to elevate your legs.  Discontinue use of crutches when you feel you are able to walk without pain or a limp.  3) Work on gentle bending and straightening of the knee.  4) You may remove the Ace wrap and dressings two days after surgery.  Place band aids over the incision sites.  5) You may shower after you remove the surgical dressing.  You do not need to cover the incision with plastic wrap.  The incision can get wet, but do not submerge under water.  After your sutures have been removed, you should wait 24 hours before submerging incision under water.  6) Pain medication can cause constipation.  You should increase your fluid intake, increase your intake of high fiber foods and/or take Metamucil as needed for constipation.  7) Continue your physical therapy exercises, as shown at the office, at least twice daily.  You should set up outpatient physical therapy and start within the first week after surgery.  8) Continue to use your Polar Pack continuously for 2-3 days after surgery.  After you remove the surgical dressing, it is a good idea to use your Polar Pack or ice pack for 30 minutes after doing your exercises to reduce swelling.  9) Do not be surprised if you have increased pain at night.  This usually means you have been a little too active during the day and need to reduce your activities.  10) If you develop lower extremity swelling that does not improve after a night of elevation, please call the office.  This could be an early sign of a blood clot.  Please call with any questions at [phone]    AMBULATORY SURGERY   DISCHARGE INSTRUCTIONS   The drugs that you were given will stay in your system until tomorrow so for the next 24 hours you should not:  Drive an automobile Make any legal decisions Drink any alcoholic beverage   You may resume regular meals tomorrow.  Today it is better to start with liquids and gradually work up to solid foods.  You may eat anything you prefer, but it is better to start with liquids, then soup and crackers, and gradually work up to solid foods.   Please notify your doctor immediately if you have any unusual bleeding, trouble breathing, redness and pain at the surgery site, drainage, fever, or pain not relieved by medication.    Additional Instructions:        Please contact your physician with any problems or Same Day Surgery at [phone], Monday through Friday 6 am to 4 pm, or [place] at [facility] number at [phone].

## 2023-01-16 NOTE — Op Note (Signed)
  PATIENT:  Brett Vaughn  PRE-OPERATIVE DIAGNOSIS:  Left Knee Meniscus Tear  POST-OPERATIVE DIAGNOSIS:  Same  PROCEDURE:  Left Knee Arthroscopic with Partial Medial and Lateral Meniscectomy, Chondroplasty, and Partial Synovectomy  SURGEON:  Kurtis Bushman, MD  ANESTHESIA:   General  PREOPERATIVE INDICATIONS:  Brett Vaughn  62 y.o. male with a diagnosis of Left Knee Meniscus Tear who failed conservative management and elected for surgical management.    The risks benefits and alternatives were discussed with the patient preoperatively including the risks of infection, bleeding, nerve injury, knee stiffness, persistent pain, osteoarthritis and the need for further surgery. Medical  risks include DVT and pulmonary embolism, myocardial infarction, stroke, pneumonia, respiratory failure and death. The patient understood these risks and wished to proceed.   OPERATIVE FINDINGS: The suprapatellar pouch, medial and lateral gutters were inspected and found to be free of any loose bodies. The undersurface of the patella and landing zone were inspected and grade 1 chondromalacia was noted. There was no evidence of lateral subluxation. The medial compartment showed a complex tear of the posterior horn, the anterior horn was intact and stable to probing. Grade 2 chondromalacia was identified in the medial compartment.  The notch was inspected and the ACL and PCL were intact and stable to probing. The lateral compartment was entered and minimal degenerative changes were identified. The lateral meniscus had degenerative tearing along the posterior horn. The anterior horn was intact a stable.  OPERATIVE PROCEDURE: Patient was met in the preoperative area. The operative extremity was signed with my initials according the hospital's correct site of surgery protocol.  The patient was brought to the operating room where they was placed supine on the operative table. General anesthesia was administered. The  patient was prepped and draped in a sterile fashion.  A timeout was performed to verify the patient's name, date of birth, medical record number, correct site of surgery correct procedure to be performed. It was also used to verify the patient received antibiotics that all appropriate instruments, and radiographic studies were available in the room. Once all in attendance were in agreement, the case began.  Proposed arthroscopy incisions were drawn out with a surgical marker. These were pre-injected with 0.5% marcaine with epinephrine. An 11 blade was used to establish an inferior lateral and inferomedial portals. The inferomedial portal was created using a 18-gauge spinal needle under direct visualization.  A full diagnostic examination of the knee was performed, please see findings for a complete list of results.  Patient had the meniscal tear treated with a 4-0 resector shaver blade and straight duckbill basket. The meniscus was debrided until a stable rim was achieved. A chondroplasty was performed in all three compartments using the shaver on reverse burr mode. A partial synovectomy was also performed in all three compartments using a 4-0 resector shaver blade and electrocautery.  The knee was then copiously lavaged. All arthroscopic instruments were removed. The 2 arthroscopy portals were closed with 4-0 nylon. A dry sterile and compressive dressing was applied. The patient was brought to the PACU in stable condition. I was scrubbed and present for the entire case and all sharp and instrument counts were correct at the conclusion the case. I spoke with the patient's family postoperatively to let them know the case was performed without complication and the patient was stable in the recovery room.  Kurtis Bushman, MD

## 2023-01-16 NOTE — Anesthesia Preprocedure Evaluation (Addendum)
Anesthesia Evaluation  Patient identified by MRN, date of birth, ID band Patient awake    Reviewed: Allergy & Precautions, NPO status , Patient's Chart, lab work & pertinent test results  History of Anesthesia Complications Negative for: history of anesthetic complications  Airway Mallampati: III  TM Distance: >3 FB Neck ROM: full    Dental  (+) Missing   Pulmonary former smoker   Pulmonary exam normal        Cardiovascular Normal cardiovascular exam     Neuro/Psych negative neurological ROS  negative psych ROS   GI/Hepatic negative GI ROS, Neg liver ROS,,,  Endo/Other  Hypothyroidism  Morbid obesity  Renal/GU      Musculoskeletal   Abdominal   Peds  Hematology negative hematology ROS (+)   Anesthesia Other Findings Past Medical History: No date: Chest pain No date: Genital herpes No date: Gout No date: H/O hypogonadism No date: History of hip replacement No date: History of weight loss surgery No date: Hypothyroidism No date: Psoriasis 03/13/2022: Squamous cell carcinoma of skin     Comment:  Mid Frontal Scalp (in situ)(CX3)  Past Surgical History: 2010: BARIATRIC SURGERY     Comment:  Lap band No date: COLONOSCOPY No date: CYST EXCISION     Comment:  from spine No date: LASIK No date: TOTAL HIP ARTHROPLASTY; Left     Reproductive/Obstetrics negative OB ROS                             Anesthesia Physical Anesthesia Plan  ASA: 3  Anesthesia Plan: General LMA   Post-op Pain Management: Toradol IV (intra-op)*, Ofirmev IV (intra-op)* and Dilaudid IV   Induction: Intravenous  PONV Risk Score and Plan: Dexamethasone, Ondansetron, Midazolam and Treatment may vary due to age or medical condition  Airway Management Planned: LMA  Additional Equipment:   Intra-op Plan:   Post-operative Plan: Extubation in OR  Informed Consent: I have reviewed the patients History  and Physical, chart, labs and discussed the procedure including the risks, benefits and alternatives for the proposed anesthesia with the patient or authorized representative who has indicated his/her understanding and acceptance.     Dental Advisory Given  Plan Discussed with: Anesthesiologist, CRNA and Surgeon  Anesthesia Plan Comments: (Patient consented for risks of anesthesia including but not limited to:  - adverse reactions to medications - damage to eyes, teeth, lips or other oral mucosa - nerve damage due to positioning  - sore throat or hoarseness - Damage to heart, brain, nerves, lungs, other parts of body or loss of life  Patient voiced understanding.)        Anesthesia Quick Evaluation

## 2023-01-16 NOTE — Anesthesia Procedure Notes (Signed)
Procedure Name: LMA Insertion Date/Time: 01/16/2023 12:09 PM  Performed by: Jonna Clark, CRNAPre-anesthesia Checklist: Patient identified, Patient being monitored, Timeout performed, Emergency Drugs available and Suction available Patient Re-evaluated:Patient Re-evaluated prior to induction Oxygen Delivery Method: Circle system utilized Preoxygenation: Pre-oxygenation with 100% oxygen Induction Type: IV induction Ventilation: Mask ventilation without difficulty LMA: LMA inserted LMA Size: 5.0 Tube type: Oral Number of attempts: 1 Placement Confirmation: positive ETCO2 and breath sounds checked- equal and bilateral Tube secured with: Tape Dental Injury: Teeth and Oropharynx as per pre-operative assessment

## 2023-01-16 NOTE — H&P (Signed)
The patient has been re-examined, and the chart reviewed, and there have been no interval changes to the documented history and physical.  Plan a left knee arthroscopy today.  Anesthesia is not consulted regarding a peripheral nerve block for post-operative pain.  The risks, benefits, and alternatives have been discussed at length, and the patient is willing to proceed.

## 2023-01-17 ENCOUNTER — Encounter: Payer: Self-pay | Admitting: Orthopedic Surgery

## 2023-01-30 DIAGNOSIS — S83207D Unspecified tear of unspecified meniscus, current injury, left knee, subsequent encounter: Secondary | ICD-10-CM | POA: Diagnosis not present

## 2023-02-02 DIAGNOSIS — Z8042 Family history of malignant neoplasm of prostate: Secondary | ICD-10-CM | POA: Diagnosis not present

## 2023-02-02 DIAGNOSIS — Z0000 Encounter for general adult medical examination without abnormal findings: Secondary | ICD-10-CM | POA: Diagnosis not present

## 2023-02-02 DIAGNOSIS — E291 Testicular hypofunction: Secondary | ICD-10-CM | POA: Diagnosis not present

## 2023-02-02 DIAGNOSIS — E039 Hypothyroidism, unspecified: Secondary | ICD-10-CM | POA: Diagnosis not present

## 2023-02-02 DIAGNOSIS — E669 Obesity, unspecified: Secondary | ICD-10-CM | POA: Diagnosis not present

## 2023-02-13 DIAGNOSIS — S83207D Unspecified tear of unspecified meniscus, current injury, left knee, subsequent encounter: Secondary | ICD-10-CM | POA: Diagnosis not present

## 2023-03-09 DIAGNOSIS — R972 Elevated prostate specific antigen [PSA]: Secondary | ICD-10-CM | POA: Diagnosis not present

## 2023-03-14 ENCOUNTER — Ambulatory Visit: Payer: BC Managed Care – PPO | Admitting: Dermatology

## 2023-04-02 DIAGNOSIS — W57XXXA Bitten or stung by nonvenomous insect and other nonvenomous arthropods, initial encounter: Secondary | ICD-10-CM | POA: Diagnosis not present

## 2023-04-02 DIAGNOSIS — S70362A Insect bite (nonvenomous), left thigh, initial encounter: Secondary | ICD-10-CM | POA: Diagnosis not present

## 2023-04-12 DIAGNOSIS — R972 Elevated prostate specific antigen [PSA]: Secondary | ICD-10-CM | POA: Diagnosis not present

## 2023-04-12 DIAGNOSIS — E349 Endocrine disorder, unspecified: Secondary | ICD-10-CM | POA: Diagnosis not present

## 2023-05-15 DIAGNOSIS — N411 Chronic prostatitis: Secondary | ICD-10-CM | POA: Diagnosis not present

## 2023-05-15 DIAGNOSIS — R972 Elevated prostate specific antigen [PSA]: Secondary | ICD-10-CM | POA: Diagnosis not present

## 2023-05-15 DIAGNOSIS — D075 Carcinoma in situ of prostate: Secondary | ICD-10-CM | POA: Diagnosis not present

## 2023-05-22 DIAGNOSIS — E349 Endocrine disorder, unspecified: Secondary | ICD-10-CM | POA: Diagnosis not present

## 2023-05-22 DIAGNOSIS — R972 Elevated prostate specific antigen [PSA]: Secondary | ICD-10-CM | POA: Diagnosis not present

## 2023-06-07 DIAGNOSIS — J069 Acute upper respiratory infection, unspecified: Secondary | ICD-10-CM | POA: Diagnosis not present

## 2023-06-09 DIAGNOSIS — J029 Acute pharyngitis, unspecified: Secondary | ICD-10-CM | POA: Diagnosis not present

## 2023-06-09 DIAGNOSIS — R509 Fever, unspecified: Secondary | ICD-10-CM | POA: Diagnosis not present

## 2023-06-09 DIAGNOSIS — Z03818 Encounter for observation for suspected exposure to other biological agents ruled out: Secondary | ICD-10-CM | POA: Diagnosis not present

## 2023-06-09 DIAGNOSIS — R52 Pain, unspecified: Secondary | ICD-10-CM | POA: Diagnosis not present

## 2023-06-09 DIAGNOSIS — R5383 Other fatigue: Secondary | ICD-10-CM | POA: Diagnosis not present

## 2023-08-03 DIAGNOSIS — M25561 Pain in right knee: Secondary | ICD-10-CM | POA: Diagnosis not present

## 2023-08-10 DIAGNOSIS — M25561 Pain in right knee: Secondary | ICD-10-CM | POA: Diagnosis not present

## 2023-08-17 DIAGNOSIS — Z0189 Encounter for other specified special examinations: Secondary | ICD-10-CM | POA: Diagnosis not present

## 2023-08-17 DIAGNOSIS — S83206A Unspecified tear of unspecified meniscus, current injury, right knee, initial encounter: Secondary | ICD-10-CM | POA: Diagnosis not present

## 2023-08-17 DIAGNOSIS — Z01818 Encounter for other preprocedural examination: Secondary | ICD-10-CM | POA: Diagnosis not present

## 2023-09-19 DIAGNOSIS — S83281A Other tear of lateral meniscus, current injury, right knee, initial encounter: Secondary | ICD-10-CM | POA: Diagnosis not present

## 2023-09-19 DIAGNOSIS — S83231A Complex tear of medial meniscus, current injury, right knee, initial encounter: Secondary | ICD-10-CM | POA: Diagnosis not present

## 2023-09-19 DIAGNOSIS — S83271A Complex tear of lateral meniscus, current injury, right knee, initial encounter: Secondary | ICD-10-CM | POA: Diagnosis not present

## 2023-09-19 DIAGNOSIS — M23361 Other meniscus derangements, other lateral meniscus, right knee: Secondary | ICD-10-CM | POA: Diagnosis not present

## 2023-09-19 DIAGNOSIS — M94261 Chondromalacia, right knee: Secondary | ICD-10-CM | POA: Diagnosis not present

## 2023-09-27 DIAGNOSIS — M25661 Stiffness of right knee, not elsewhere classified: Secondary | ICD-10-CM | POA: Diagnosis not present

## 2023-09-27 DIAGNOSIS — M25561 Pain in right knee: Secondary | ICD-10-CM | POA: Diagnosis not present

## 2023-11-29 DIAGNOSIS — L820 Inflamed seborrheic keratosis: Secondary | ICD-10-CM | POA: Diagnosis not present

## 2023-11-29 DIAGNOSIS — L821 Other seborrheic keratosis: Secondary | ICD-10-CM | POA: Diagnosis not present

## 2023-11-29 DIAGNOSIS — L814 Other melanin hyperpigmentation: Secondary | ICD-10-CM | POA: Diagnosis not present

## 2023-11-29 DIAGNOSIS — L570 Actinic keratosis: Secondary | ICD-10-CM | POA: Diagnosis not present

## 2023-11-29 DIAGNOSIS — L578 Other skin changes due to chronic exposure to nonionizing radiation: Secondary | ICD-10-CM | POA: Diagnosis not present

## 2024-02-04 DIAGNOSIS — L570 Actinic keratosis: Secondary | ICD-10-CM | POA: Diagnosis not present

## 2024-02-04 DIAGNOSIS — K219 Gastro-esophageal reflux disease without esophagitis: Secondary | ICD-10-CM | POA: Diagnosis not present

## 2024-02-04 DIAGNOSIS — M109 Gout, unspecified: Secondary | ICD-10-CM | POA: Diagnosis not present

## 2024-02-04 DIAGNOSIS — R7309 Other abnormal glucose: Secondary | ICD-10-CM | POA: Diagnosis not present

## 2024-02-04 DIAGNOSIS — E039 Hypothyroidism, unspecified: Secondary | ICD-10-CM | POA: Diagnosis not present

## 2024-02-04 DIAGNOSIS — R972 Elevated prostate specific antigen [PSA]: Secondary | ICD-10-CM | POA: Diagnosis not present

## 2024-02-04 DIAGNOSIS — M1711 Unilateral primary osteoarthritis, right knee: Secondary | ICD-10-CM | POA: Diagnosis not present

## 2024-02-04 DIAGNOSIS — Z13220 Encounter for screening for lipoid disorders: Secondary | ICD-10-CM | POA: Diagnosis not present

## 2024-02-04 DIAGNOSIS — E291 Testicular hypofunction: Secondary | ICD-10-CM | POA: Diagnosis not present

## 2024-02-06 DIAGNOSIS — Z1211 Encounter for screening for malignant neoplasm of colon: Secondary | ICD-10-CM | POA: Diagnosis not present

## 2024-02-06 DIAGNOSIS — Z0000 Encounter for general adult medical examination without abnormal findings: Secondary | ICD-10-CM | POA: Diagnosis not present

## 2024-02-06 DIAGNOSIS — F411 Generalized anxiety disorder: Secondary | ICD-10-CM | POA: Diagnosis not present

## 2024-02-06 DIAGNOSIS — E291 Testicular hypofunction: Secondary | ICD-10-CM | POA: Diagnosis not present

## 2024-02-06 DIAGNOSIS — E039 Hypothyroidism, unspecified: Secondary | ICD-10-CM | POA: Diagnosis not present

## 2024-02-06 DIAGNOSIS — M109 Gout, unspecified: Secondary | ICD-10-CM | POA: Diagnosis not present

## 2024-03-14 DIAGNOSIS — K219 Gastro-esophageal reflux disease without esophagitis: Secondary | ICD-10-CM | POA: Diagnosis not present

## 2024-03-25 DIAGNOSIS — L570 Actinic keratosis: Secondary | ICD-10-CM | POA: Diagnosis not present

## 2024-03-25 DIAGNOSIS — L821 Other seborrheic keratosis: Secondary | ICD-10-CM | POA: Diagnosis not present

## 2024-06-22 DIAGNOSIS — M25512 Pain in left shoulder: Secondary | ICD-10-CM | POA: Diagnosis not present

## 2024-07-11 DIAGNOSIS — S40012A Contusion of left shoulder, initial encounter: Secondary | ICD-10-CM | POA: Diagnosis not present

## 2024-08-05 DIAGNOSIS — M109 Gout, unspecified: Secondary | ICD-10-CM | POA: Diagnosis not present

## 2024-08-05 DIAGNOSIS — R7309 Other abnormal glucose: Secondary | ICD-10-CM | POA: Diagnosis not present

## 2024-08-05 DIAGNOSIS — R5383 Other fatigue: Secondary | ICD-10-CM | POA: Diagnosis not present

## 2024-08-05 DIAGNOSIS — E291 Testicular hypofunction: Secondary | ICD-10-CM | POA: Diagnosis not present

## 2024-08-05 DIAGNOSIS — Z13220 Encounter for screening for lipoid disorders: Secondary | ICD-10-CM | POA: Diagnosis not present

## 2024-08-05 DIAGNOSIS — K219 Gastro-esophageal reflux disease without esophagitis: Secondary | ICD-10-CM | POA: Diagnosis not present

## 2024-08-05 DIAGNOSIS — E039 Hypothyroidism, unspecified: Secondary | ICD-10-CM | POA: Diagnosis not present

## 2024-08-08 DIAGNOSIS — R7309 Other abnormal glucose: Secondary | ICD-10-CM | POA: Diagnosis not present

## 2024-08-08 DIAGNOSIS — Z23 Encounter for immunization: Secondary | ICD-10-CM | POA: Diagnosis not present

## 2024-08-08 DIAGNOSIS — E291 Testicular hypofunction: Secondary | ICD-10-CM | POA: Diagnosis not present

## 2024-08-08 DIAGNOSIS — E039 Hypothyroidism, unspecified: Secondary | ICD-10-CM | POA: Diagnosis not present

## 2024-08-08 DIAGNOSIS — K219 Gastro-esophageal reflux disease without esophagitis: Secondary | ICD-10-CM | POA: Diagnosis not present
